# Patient Record
Sex: Male | Born: 1990 | Race: Black or African American | Hispanic: No | Marital: Single | Smoking: Former smoker
Health system: Southern US, Community
[De-identification: ages and names within clinical notes are randomized; demographics above are authoritative.]

## PROBLEM LIST (undated history)

## (undated) DIAGNOSIS — R011 Cardiac murmur, unspecified: Secondary | ICD-10-CM

## (undated) DIAGNOSIS — G43909 Migraine, unspecified, not intractable, without status migrainosus: Secondary | ICD-10-CM

## (undated) DIAGNOSIS — J45909 Unspecified asthma, uncomplicated: Secondary | ICD-10-CM

---

## 2013-03-16 ENCOUNTER — Emergency Department (HOSPITAL_COMMUNITY)
Admission: EM | Admit: 2013-03-16 | Discharge: 2013-03-16 | Disposition: A | Discharge status: Home / Self Care | Payer: BC Managed Care – PPO | Attending: Emergency Medicine | Admitting: Emergency Medicine

## 2013-03-16 ENCOUNTER — Encounter (HOSPITAL_COMMUNITY): Payer: Self-pay | Admitting: Emergency Medicine

## 2013-03-16 DIAGNOSIS — J45909 Unspecified asthma, uncomplicated: Secondary | ICD-10-CM | POA: Insufficient documentation

## 2013-03-16 DIAGNOSIS — F17200 Nicotine dependence, unspecified, uncomplicated: Secondary | ICD-10-CM | POA: Insufficient documentation

## 2013-03-16 DIAGNOSIS — R0789 Other chest pain: Secondary | ICD-10-CM | POA: Insufficient documentation

## 2013-03-16 DIAGNOSIS — Z79899 Other long term (current) drug therapy: Secondary | ICD-10-CM | POA: Insufficient documentation

## 2013-03-16 DIAGNOSIS — J069 Acute upper respiratory infection, unspecified: Secondary | ICD-10-CM | POA: Insufficient documentation

## 2013-03-16 HISTORY — DX: Unspecified asthma, uncomplicated: J45.909

## 2013-03-16 MED ORDER — ALBUTEROL SULFATE HFA 108 (90 BASE) MCG/ACT IN AERS: 1.0000 | INHALATION_SPRAY | Freq: Four times a day (QID) | RESPIRATORY_TRACT | Status: DC | PRN

## 2013-03-16 MED ORDER — ALBUTEROL SULFATE HFA 108 (90 BASE) MCG/ACT IN AERS
2.0000 | INHALATION_SPRAY | Freq: Once | RESPIRATORY_TRACT | Status: AC
  Administered 2013-03-16: 2 via RESPIRATORY_TRACT
  Filled 2013-03-16: qty 6.7

## 2013-03-16 NOTE — Discharge Instructions (Signed)
Upper Respiratory Infection, Adult An upper respiratory infection (URI) is also known as the common cold. It is often caused by a type of germ (virus). Colds are easily spread (contagious). You can pass it to others by kissing, coughing, sneezing, or drinking out of the same glass. Usually, you get better in 1 or 2 weeks.  HOME CARE   Only take medicine as told by your doctor.  Use a warm mist humidifier or breathe in steam from a hot shower.  Drink enough water and fluids to keep your pee (urine) clear or pale yellow.  Get plenty of rest.  Return to work when your temperature is back to normal or as told by your doctor. You may use a face mask and wash your hands to stop your cold from spreading. GET HELP RIGHT AWAY IF:   After the first few days, you feel you are getting worse.  You have questions about your medicine.  You have chills, shortness of breath, or brown or red spit (mucus).  You have yellow or brown snot (nasal discharge) or pain in the face, especially when you bend forward.  You have a fever, puffy (swollen) neck, pain when you swallow, or white spots in the back of your throat.  You have a bad headache, ear pain, sinus pain, or chest pain.  You have a high-pitched whistling sound when you breathe in and out (wheezing).  You have a lasting cough or cough up blood.  You have sore muscles or a stiff neck. MAKE SURE YOU:   Understand these instructions.  Will watch your condition.  Will get help right away if you are not doing well or get worse. Document Released: 06/18/2007 Document Revised: 03/24/2011 Document Reviewed: 05/06/2010 [facility] Patient Information 2014 [place], [place].   Use inhaler as needed Increase oral fluids

## 2013-03-16 NOTE — ED Notes (Signed)
Cold symptoms; runny nose; asthma symptoms x 4 days; wheezing at times

## 2013-03-16 NOTE — ED Provider Notes (Signed)
CSN: [id number]     Arrival date & time 03/16/13  1257 History   This chart was scribed for [name] by Ladona Ridgel Day, ED scribe. This patient was seen in room WTR7/WTR7 and the patient's care was started at 1257.  Chief Complaint  Patient presents with  . URI   The history is provided by the patient. No language interpreter was used.   HPI Comments: Glenn Davis is a 23 y.o. male who presents to the Emergency Department h/o asthma complaining of constant, gradually worsened chest tightness, onset 4 days ago. He reports that his sx feel similar to previous episodes of asthma/cough flare ups that he had years ago. He has not had to use an inhaler in years for this problem. He reports associated wheezing especially at night. He reports born w/bronchitis and asthma. He states a mild cough/cold.    Past Medical History  Diagnosis Date  . Asthma    History reviewed. No pertinent past surgical history. No family history on file. History  Substance Use Topics  . Smoking status: Current Every Day Smoker -- 0.50 packs/day    Types: Cigarettes  . Smokeless tobacco: Not on file  . Alcohol Use: Not on file    Review of Systems  Constitutional: Negative for fever and chills.  Respiratory: Positive for cough and chest tightness. Negative for shortness of breath.   Cardiovascular: Negative for chest pain.  Gastrointestinal: Negative for abdominal pain.  Musculoskeletal: Negative for back pain.  All other systems reviewed and are negative.    Allergies  Review of patient's allergies indicates no known allergies.  Home Medications   Current Outpatient Rx  Name  Route  Sig  Dispense  Refill  . albuterol (PROVENTIL HFA;VENTOLIN HFA) 108 (90 BASE) MCG/ACT inhaler   Inhalation   Inhale 1-2 puffs into the lungs every 6 (six) hours as needed for wheezing or shortness of breath.   1 Inhaler   0     Triage Vitals: BP 134/73  Pulse 73  Temp(Src) 97.9 F (36.6 C) (Oral)  Resp 16  SpO2  100%  Physical Exam  Nursing note and vitals reviewed. Constitutional: He is oriented to person, place, and time. He appears well-developed and well-nourished. No distress.  HENT:  Head: Normocephalic and atraumatic.  Fluid behind bilateral TMs  Eyes: Conjunctivae are normal. Right eye exhibits no discharge. Left eye exhibits no discharge.  Neck: Normal range of motion.  Cardiovascular: Normal rate, regular rhythm and normal heart sounds.   No murmur heard. Pulmonary/Chest: Effort normal and breath sounds normal. No respiratory distress. He has no wheezes. He has no rales.  Musculoskeletal: Normal range of motion. He exhibits no edema.  Neurological: He is alert and oriented to person, place, and time.  Skin: Skin is warm and dry.  Psychiatric: He has a normal mood and affect. Thought content normal.    ED Course  Procedures (including critical care time) DIAGNOSTIC STUDIES: Oxygen Saturation is 100% on room air, normal by my interpretation.    COORDINATION OF CARE: At 225 PM Discussed treatment plan with patient which includes breathing tx. Patient agrees.   Labs Review Labs Reviewed - No data to display Imaging Review No results found.   EKG Interpretation None      MDM   Final diagnoses:  URI (upper respiratory infection)   Albuterol given here with some relief. Pt reports that he has a history of asthma and when he gets a cold it sometimes triggers his asthma. No  tachypnea, tachycardia or hypoxia. SPO2 100%. Feels like he is ok to go home. Discussed plan of care with pt and he agrees. Return precautions given.   I personally performed the services described in this documentation, which was scribed in my presence. The recorded information has been reviewed and is accurate.      Irish EldersKelly Arayah Krouse, NP 03/22/13 [phone]

## 2013-03-23 NOTE — ED Provider Notes (Signed)
Medical screening examination/treatment/procedure(s) were performed by non-physician practitioner and as supervising physician I was immediately available for consultation/collaboration.   EKG Interpretation None        David H Yao, MD 03/23/13 1059 

## 2013-11-14 ENCOUNTER — Encounter (HOSPITAL_COMMUNITY): Payer: Self-pay | Admitting: Emergency Medicine

## 2013-11-14 ENCOUNTER — Emergency Department (HOSPITAL_COMMUNITY)
Admission: EM | Admit: 2013-11-14 | Discharge: 2013-11-14 | Disposition: A | Discharge status: Home / Self Care | Payer: BC Managed Care – PPO | Attending: Emergency Medicine | Admitting: Emergency Medicine

## 2013-11-14 DIAGNOSIS — J45909 Unspecified asthma, uncomplicated: Secondary | ICD-10-CM | POA: Diagnosis not present

## 2013-11-14 DIAGNOSIS — Z79899 Other long term (current) drug therapy: Secondary | ICD-10-CM | POA: Diagnosis not present

## 2013-11-14 DIAGNOSIS — J029 Acute pharyngitis, unspecified: Secondary | ICD-10-CM | POA: Insufficient documentation

## 2013-11-14 DIAGNOSIS — B349 Viral infection, unspecified: Secondary | ICD-10-CM | POA: Diagnosis not present

## 2013-11-14 DIAGNOSIS — Z720 Tobacco use: Secondary | ICD-10-CM | POA: Diagnosis not present

## 2013-11-14 DIAGNOSIS — G4489 Other headache syndrome: Secondary | ICD-10-CM

## 2013-11-14 DIAGNOSIS — R112 Nausea with vomiting, unspecified: Secondary | ICD-10-CM | POA: Insufficient documentation

## 2013-11-14 LAB — RAPID STREP SCREEN (MED CTR MEBANE ONLY): Streptococcus, Group A Screen (Direct): NEGATIVE

## 2013-11-14 MED ORDER — ONDANSETRON 4 MG PO TBDP
4.0000 mg | ORAL_TABLET | Freq: Once | ORAL | Status: AC
  Administered 2013-11-14: 4 mg via ORAL
  Filled 2013-11-14: qty 1

## 2013-11-14 MED ORDER — KETOROLAC TROMETHAMINE 60 MG/2ML IM SOLN
60.0000 mg | Freq: Once | INTRAMUSCULAR | Status: AC
  Administered 2013-11-14: 60 mg via INTRAMUSCULAR
  Filled 2013-11-14: qty 2

## 2013-11-14 MED ORDER — ACETAMINOPHEN 325 MG PO TABS
650.0000 mg | ORAL_TABLET | Freq: Once | ORAL | Status: AC
  Administered 2013-11-14: 650 mg via ORAL
  Filled 2013-11-14: qty 2

## 2013-11-14 MED ORDER — IBUPROFEN 600 MG PO TABS: 600.0000 mg | ORAL_TABLET | Freq: Four times a day (QID) | ORAL | Status: DC | PRN

## 2013-11-14 NOTE — ED Notes (Signed)
Pt c/o headache, sore throat and eye pain for past c/o days. Pt states ever since he started working at his new job in 20 degrees.

## 2013-11-14 NOTE — ED Provider Notes (Signed)
CSN: [id number]     Arrival date & time 11/14/13  0807 History   First MD Initiated Contact with Patient 11/14/13 [phone]     Chief Complaint  Patient presents with  . Headache  . Sore Throat     (Consider location/radiation/quality/duration/timing/severity/associated sxs/prior Treatment) HPI  This is a 23 year old with a history of asthma who presents with headache. Sore throat. Patient reports several days of headache. He reports that is bitemporal and "behind my eyes."  Current pain is 7 out of 10. Patient states that he also developed sore throat and fever yesterday. He is not had any difficulty swallowing. Reports fever to 101. Denies any neck stiffness or pain. He states that he vomited once at work earlier today and was instructed that he needed to be seen by Dr. He denies any diarrhea or abdominal pain. He denies any chest pain, shortness of breath, or cough.  Past Medical History  Diagnosis Date  . Asthma    History reviewed. No pertinent past surgical history. No family history on file. History  Substance Use Topics  . Smoking status: Current Every Day Smoker -- 0.50 packs/day    Types: Cigarettes  . Smokeless tobacco: Not on file  . Alcohol Use: No    Review of Systems  Constitutional: Positive for fever and chills.  HENT: Positive for sore throat.   Respiratory: Negative.  Negative for cough, chest tightness and shortness of breath.   Cardiovascular: Negative.  Negative for chest pain.  Gastrointestinal: Positive for nausea and vomiting. Negative for abdominal pain and diarrhea.  Genitourinary: Negative.  Negative for dysuria.  Musculoskeletal: Negative for back pain.  Skin: Negative for rash.  Neurological: Positive for headaches. Negative for dizziness, weakness, light-headedness and numbness.  All other systems reviewed and are negative.     Allergies  Review of patient's allergies indicates no known allergies.  Home Medications   Prior to Admission  medications   Medication Sig Start Date End Date Taking? Authorizing Provider  acetaminophen (TYLENOL) 325 MG tablet Take 325 mg by mouth every 6 (six) hours as needed for headache (headache).   Yes Historical Provider, MD  albuterol (PROVENTIL HFA;VENTOLIN HFA) 108 (90 BASE) MCG/ACT inhaler Inhale 2 puffs into the lungs every 6 (six) hours as needed for wheezing or shortness of breath (wheezing).   Yes Historical Provider, MD  albuterol (PROVENTIL HFA;VENTOLIN HFA) 108 (90 BASE) MCG/ACT inhaler Inhale 1-2 puffs into the lungs every 6 (six) hours as needed for wheezing or shortness of breath. 03/16/13   Irish EldersKelly Walker, NP  ibuprofen (ADVIL,MOTRIN) 600 MG tablet Take 1 tablet (600 mg total) by mouth every 6 (six) hours as needed. 11/14/13   Shon Batonourtney F Horton, MD   BP 141/86 mmHg  Pulse 82  Temp(Src) 98.2 F (36.8 C) (Oral)  Resp 18  SpO2 100% Physical Exam  Constitutional: He is oriented to person, place, and time. He appears well-developed and well-nourished. No distress.  HENT:  Head: Normocephalic and atraumatic.  Mild erythema and swelling to the posterior oropharynx and tonsils, uvula midline, no trismus  Eyes: Pupils are equal, round, and reactive to light.  Neck: Normal range of motion. Neck supple.  No meningismus  Cardiovascular: Normal rate, regular rhythm and normal heart sounds.   No murmur heard. Pulmonary/Chest: Effort normal and breath sounds normal. No respiratory distress. He has no wheezes.  Abdominal: Soft. Bowel sounds are normal. There is no tenderness. There is no rebound.  Musculoskeletal: He exhibits no edema.  Lymphadenopathy:  He has cervical adenopathy.  Neurological: He is alert and oriented to person, place, and time.  Skin: Skin is warm and dry.  Psychiatric: He has a normal mood and affect.  Nursing note and vitals reviewed.   ED Course  Procedures (including critical care time) Labs Review Labs Reviewed  RAPID STREP SCREEN  CULTURE, GROUP A STREP     Imaging Review No results found.   EKG Interpretation None      MDM   Final diagnoses:  Viral syndrome  Other headache syndrome  Sore throat   Patient presents with multiple complaints including headache, sore throat, fever and isolated vomiting. He is nontoxic and afebrile on exam. Exam is largely unremarkable. He does have mild erythema of the posterior oropharynx. He is nonfocal. Patient was given Toradol. Strep screen negative. Subsequently given Tylenol and Zofran with improvement of his symptoms. Suspect viral syndrome. Low suspicion at this time for meningitis. No evidence of meningismus patient is very well-appearing. Discuss with patient supportive care at home. [facility].  After history, exam, and medical workup I feel the patient has been appropriately medically screened and is safe for discharge home. Pertinent diagnoses were discussed with the patient. Patient was given return precautions.     Shon Batonourtney F Horton, MD 11/14/13 1045

## 2013-11-14 NOTE — Discharge Instructions (Signed)

## 2013-11-16 LAB — CULTURE, GROUP A STREP

## 2017-04-20 ENCOUNTER — Other Ambulatory Visit: Payer: Self-pay

## 2017-04-20 ENCOUNTER — Emergency Department (HOSPITAL_COMMUNITY)
Admission: EM | Admit: 2017-04-20 | Discharge: 2017-04-20 | Disposition: A | Discharge status: Home / Self Care | Payer: Self-pay | Attending: Emergency Medicine | Admitting: Emergency Medicine

## 2017-04-20 ENCOUNTER — Emergency Department (HOSPITAL_COMMUNITY): Payer: Self-pay

## 2017-04-20 DIAGNOSIS — J4521 Mild intermittent asthma with (acute) exacerbation: Secondary | ICD-10-CM | POA: Insufficient documentation

## 2017-04-20 LAB — BASIC METABOLIC PANEL
Anion gap: 12 (ref 5–15)
BUN: 10 mg/dL (ref 6–20)
CO2: 23 mmol/L (ref 22–32)
Calcium: 9.4 mg/dL (ref 8.9–10.3)
Chloride: 104 mmol/L (ref 101–111)
Creatinine, Ser: 0.91 mg/dL (ref 0.61–1.24)
GFR calc Af Amer: >60 mL/min (ref >60)
GFR calc non Af Amer: >60 mL/min (ref >60)
GLUCOSE: 98 mg/dL (ref 65–99)
POTASSIUM: 3.8 mmol/L (ref 3.5–5.1)
Sodium: 139 mmol/L (ref 135–145)

## 2017-04-20 LAB — CBC
HEMATOCRIT: 44.7 % (ref 39.0–52.0)
Hemoglobin: 14.7 g/dL (ref 13.0–17.0)
MCH: 29.5 pg (ref 26.0–34.0)
MCHC: 32.9 g/dL (ref 30.0–36.0)
MCV: 89.6 fL (ref 78.0–100.0)
Platelets: 259 10*3/uL (ref 150–400)
RBC: 4.99 MIL/uL (ref 4.22–5.81)
RDW: 12.9 % (ref 11.5–15.5)
WBC: 8.6 10*3/uL (ref 4.0–10.5)

## 2017-04-20 LAB — I-STAT TROPONIN, ED: Troponin i, poc: 0 ng/mL (ref 0.00–0.08)

## 2017-04-20 MED ORDER — ALBUTEROL SULFATE HFA 108 (90 BASE) MCG/ACT IN AERS: 1.0000 | INHALATION_SPRAY | Freq: Four times a day (QID) | RESPIRATORY_TRACT | 0 refills | Status: AC | PRN

## 2017-04-20 MED ORDER — ALBUTEROL SULFATE (2.5 MG/3ML) 0.083% IN NEBU
5.0000 mg | INHALATION_SOLUTION | Freq: Once | RESPIRATORY_TRACT | Status: AC
  Administered 2017-04-20: 5 mg via RESPIRATORY_TRACT
  Filled 2017-04-20: qty 6

## 2017-04-20 MED ORDER — BENZONATATE 100 MG PO CAPS: 100.0000 mg | ORAL_CAPSULE | Freq: Three times a day (TID) | ORAL | 0 refills | Status: DC

## 2017-04-20 MED ORDER — ALBUTEROL SULFATE HFA 108 (90 BASE) MCG/ACT IN AERS
1.0000 | INHALATION_SPRAY | RESPIRATORY_TRACT | Status: DC | PRN
  Administered 2017-04-20: 1 via RESPIRATORY_TRACT
  Filled 2017-04-20: qty 6.7

## 2017-04-20 NOTE — ED Provider Notes (Signed)
[facility] EMERGENCY DEPARTMENT Provider Note   CSN: [id number] Arrival date & time: 04/20/17  4098     History   Chief Complaint Chief Complaint  Patient presents with  . Asthma  . Chest Pain    HPI Glenn Davis is a 27 y.o. male.  HPI   27 year old male with a history of asthma presents today with asthma exacerbation.  Patient notes that he has had a month since a kid used to take Advair but no longer needed it.  He reports he rarely has asthma exacerbations but over the last week he has had a minor cough and feels as if he has a light cold.  He also notes that he has been around a lot of dust at work which he attributes to the flare.  Patient notes he does not have an inhaler at home and felt chest tightness today at work.  Patient denies any cardiac or other pulmonary history including DVT or PE.  Patient was given a breathing treatment prior to my assessment which he reports significantly improved his symptoms.  Patient denies any fever.  Patient reports he is a smoker.  Past Medical History:  Diagnosis Date  . Asthma     There are no active problems to display for this patient.   No past surgical history on file.      Home Medications    Prior to Admission medications   Medication Sig Start Date End Date Taking? Authorizing Provider  acetaminophen (TYLENOL) 325 MG tablet Take 325 mg by mouth every 6 (six) hours as needed for headache (headache).    [provider]  albuterol (PROVENTIL HFA;VENTOLIN HFA) 108 (90 Base) MCG/ACT inhaler Inhale 1-2 puffs into the lungs every 6 (six) hours as needed for wheezing or shortness of breath. 04/20/17   Khup Sapia, Tinnie Gens, PA-C  benzonatate (TESSALON) 100 MG capsule Take 1 capsule (100 mg total) by mouth every 8 (eight) hours. 04/20/17   Perrin Eddleman, Tinnie Gens, PA-C  ibuprofen (ADVIL,MOTRIN) 600 MG tablet Take 1 tablet (600 mg total) by mouth every 6 (six) hours as needed. 11/14/13   Horton, Mayer Masker, MD    Family  History No family history on file.  Social History Social History   Tobacco Use  . Smoking status: Current Every Day Smoker    Packs/day: 0.50    Types: Cigarettes  Substance Use Topics  . Alcohol use: No  . Drug use: Not on file     Allergies   Patient has no known allergies.   Review of Systems Review of Systems  All other systems reviewed and are negative.    Physical Exam Updated Vital Signs BP 122/76 (BP Location: Right Arm)   Pulse 66   Temp 98.9 F (37.2 C) (Oral)   Resp 16   Wt 79.4 kg (175 lb)   SpO2 100%   Physical Exam  Constitutional: He is oriented to person, place, and time. He appears well-developed and well-nourished.  HENT:  Head: Normocephalic and atraumatic.  Eyes: Pupils are equal, round, and reactive to light. Conjunctivae are normal. Right eye exhibits no discharge. Left eye exhibits no discharge. No scleral icterus.  Neck: Normal range of motion. No JVD present. No tracheal deviation present.  Cardiovascular: Normal rate, regular rhythm, normal heart sounds and intact distal pulses. Exam reveals no gallop and no friction rub.  No murmur heard. Pulmonary/Chest: Effort normal and breath sounds normal. No stridor. No respiratory distress. He has no wheezes. He has no rales.  Neurological: He is alert and oriented to person, place, and time. Coordination normal.  Psychiatric: He has a normal mood and affect. His behavior is normal. Judgment and thought content normal.  Nursing note and vitals reviewed.   ED Treatments / Results  Labs (all labs ordered are listed, but only abnormal results are displayed) Labs Reviewed  BASIC METABOLIC PANEL  CBC  I-STAT TROPONIN, ED    EKG None   ED ECG REPORT   Date: 04/20/2017  Rate: 67  Rhythm: normal sinus rhythm  QRS Axis: normal  Intervals: normal  ST/T Wave abnormalities: normal  Conduction Disutrbances:none  Narrative Interpretation:   Old EKG Reviewed: none available  I have  personally reviewed the EKG tracing and agree with the computerized printout as noted.   Radiology Dg Chest 2 View  Result Date: 04/20/2017 CLINICAL DATA:  Chest tightness, intermittent shortness of breath, and wheezing for the past 2 days. Possible dust inhalation triggered the episode. History of asthma, current smoker. EXAM: CHEST - 2 VIEW COMPARISON:  None in PACs FINDINGS: The lungs are adequately inflated and clear. The heart and pulmonary vascularity are normal. The mediastinum is normal in width. There is no pleural effusion. The trachea is midline. The bony thorax exhibits no acute abnormality. IMPRESSION: There is no pneumonia nor other acute cardiopulmonary abnormality. Electronically Signed   By: David  SwazilandJordan M.D.   On: 04/20/2017 10:23    Procedures Procedures (including critical care time)  Medications Ordered in ED Medications  albuterol (PROVENTIL HFA;VENTOLIN HFA) 108 (90 Base) MCG/ACT inhaler 1-2 puff (has no administration in time range)  albuterol (PROVENTIL) (2.5 MG/3ML) 0.083% nebulizer solution 5 mg (5 mg Nebulization Given 04/20/17 0959)     Initial Impression / Assessment and Plan / ED Course  I have reviewed the triage vital signs and the nursing notes.  Pertinent labs & imaging results that were available during my care of the patient were reviewed by me and considered in my medical decision making (see chart for details).      Final Clinical Impressions(s) / ED Diagnoses   Final diagnoses:  Mild intermittent asthma with exacerbation   Labs: I-STAT troponin, BMP, CBC  Imaging: DG chest 2 view, ED EKG  Consults:  Therapeutics: Albuterol  Discharge Meds: Albuterol, tessalon   Assessment/Plan: 27 year old male presents today with likely asthma exacerbation.  Patient reports this is similar to previous.  I have very low suspicion for cardiac etiology of his symptoms today.  Symptoms improved with breathing treatment.  Patient has reassuring analysis  here including EKG, chest x-ray and labs.  Patient be discharged with cough medication, albuterol inhaler and encouragement to follow-up with primary care for further evaluation management.  Patient given strict return precautions, he verbalized understanding and agreement to today's plan had no further questions or concerns   ED Discharge Orders        Ordered    benzonatate (TESSALON) 100 MG capsule  Every 8 hours     04/20/17 1240    albuterol (PROVENTIL HFA;VENTOLIN HFA) 108 (90 Base) MCG/ACT inhaler  Every 6 hours PRN     04/20/17 1241       Eyvonne MechanicHedges, Deundra Furber, PA-C 04/20/17 1242    Tilden Fossaees, Elizabeth, MD 04/20/17 [phone]

## 2017-04-20 NOTE — Discharge Instructions (Addendum)
Please read attached information. If you experience any new or worsening signs or symptoms please return to the emergency room for evaluation. Please follow-up with your primary care provider or specialist as discussed. Please use medication prescribed only as directed and discontinue taking if you have any concerning signs or symptoms.   °

## 2017-04-20 NOTE — ED Triage Notes (Signed)
PT reports history of asthma. PT reports chest tightness, intermittent SOB, and wheezing for 2 days. PT no longer has an albuterol inhaler. PT reports today's flare up started as he was sweeping the floor. He believes the dust aggravated his breathing.   PT reports lightheadedness with episode this AM.

## 2017-05-12 ENCOUNTER — Emergency Department (HOSPITAL_COMMUNITY)
Admission: EM | Admit: 2017-05-12 | Discharge: 2017-05-12 | Disposition: A | Discharge status: Home / Self Care | Payer: Self-pay | Attending: Emergency Medicine | Admitting: Emergency Medicine

## 2017-05-12 ENCOUNTER — Encounter (HOSPITAL_COMMUNITY): Payer: Self-pay | Admitting: Emergency Medicine

## 2017-05-12 DIAGNOSIS — F17210 Nicotine dependence, cigarettes, uncomplicated: Secondary | ICD-10-CM | POA: Insufficient documentation

## 2017-05-12 DIAGNOSIS — J45909 Unspecified asthma, uncomplicated: Secondary | ICD-10-CM | POA: Insufficient documentation

## 2017-05-12 DIAGNOSIS — R519 Headache, unspecified: Secondary | ICD-10-CM

## 2017-05-12 DIAGNOSIS — R51 Headache: Secondary | ICD-10-CM | POA: Insufficient documentation

## 2017-05-12 HISTORY — DX: Migraine, unspecified, not intractable, without status migrainosus: G43.909

## 2017-05-12 MED ORDER — SODIUM CHLORIDE 0.9 % IV BOLUS
1000.0000 mL | Freq: Once | INTRAVENOUS | Status: AC
  Administered 2017-05-12: 1000 mL via INTRAVENOUS

## 2017-05-12 MED ORDER — BUTALBITAL-APAP-CAFFEINE 50-325-40 MG PO TABS: 1.0000 | ORAL_TABLET | Freq: Four times a day (QID) | ORAL | 0 refills | Status: AC | PRN

## 2017-05-12 MED ORDER — PROCHLORPERAZINE EDISYLATE 10 MG/2ML IJ SOLN
10.0000 mg | Freq: Once | INTRAMUSCULAR | Status: AC
  Administered 2017-05-12: 10 mg via INTRAVENOUS
  Filled 2017-05-12: qty 2

## 2017-05-12 MED ORDER — DIPHENHYDRAMINE HCL 50 MG/ML IJ SOLN
25.0000 mg | Freq: Once | INTRAMUSCULAR | Status: AC
  Administered 2017-05-12: 25 mg via INTRAVENOUS
  Filled 2017-05-12: qty 1

## 2017-05-12 NOTE — ED Triage Notes (Signed)
Pt reports had intermittent migraines for past 2 days. Will go away with advil then come back. denies n/v reports "depends" when asked if sensitive to light.

## 2017-05-12 NOTE — ED Provider Notes (Signed)
[facility]-EMERGENCY DEPT Provider Note   CSN: [id number] Arrival date & time: 05/12/17  1032     History   Chief Complaint Chief Complaint  Patient presents with  . Migraine    HPI Glenn Davis is a 27 y.o. male here for evaluation of headache x 1 week. Pain is intermittent. Pain is localized to forehead.  Associated with photophobia.  Has tried tylenol which provides brief relief of pain but pain returns. HA more intense in the last 2 days.  States he does he HA occasionally that usually last 1-2 days an resolve with tylenol.  Has been trying to cut back on cigarette use in the last 2 weeks.    No fevers, chills, neck stiffness, visual disturbances, vomiting, one sided numbness or weakness, slurred speech, dizziness, trauma, anticoagulation, syncope, seizures, generalized rash. No h/o CVA/TIA, HTN or immunocompromise.HPI  Past Medical History:  Diagnosis Date  . Asthma   . Migraine     There are no active problems to display for this patient.   History reviewed. No pertinent surgical history.      Home Medications    Prior to Admission medications   Medication Sig Start Date End Date Taking? Authorizing Provider  acetaminophen (TYLENOL) 500 MG tablet Take 500 mg by mouth daily as needed (migraine).   Yes [provider]  albuterol (PROVENTIL HFA;VENTOLIN HFA) 108 (90 Base) MCG/ACT inhaler Inhale 1-2 puffs into the lungs every 6 (six) hours as needed for wheezing or shortness of breath. 04/20/17  Yes Hedges, Tinnie Gens, PA-C  ibuprofen (ADVIL,MOTRIN) 200 MG tablet Take 200 mg by mouth daily as needed (migraine).   Yes [provider]  benzonatate (TESSALON) 100 MG capsule Take 1 capsule (100 mg total) by mouth every 8 (eight) hours. Patient not taking: Reported on 05/12/2017 04/20/17   Hedges, Tinnie Gens, PA-C  butalbital-acetaminophen-caffeine (FIORICET, ESGIC) [phone] MG tablet Take 1-2 tablets by mouth every 6 (six) hours as needed for  headache. 05/12/17 05/12/18  Liberty Handy, PA-C  ibuprofen (ADVIL,MOTRIN) 600 MG tablet Take 1 tablet (600 mg total) by mouth every 6 (six) hours as needed. Patient not taking: Reported on 05/12/2017 11/14/13   Horton, Mayer Masker, MD    Family History No family history on file.  Social History Social History   Tobacco Use  . Smoking status: Current Every Day Smoker    Packs/day: 0.50    Types: Cigarettes  . Smokeless tobacco: Never Used  Substance Use Topics  . Alcohol use: No  . Drug use: Not on file     Allergies   Patient has no known allergies.   Review of Systems Review of Systems  Eyes: Positive for photophobia.  Neurological: Positive for headaches.  All other systems reviewed and are negative.    Physical Exam Updated Vital Signs BP 126/70 (BP Location: Right Arm)   Pulse 71   Temp 98.3 F (36.8 C) (Oral)   Resp 18   Ht  (1.651 m)   Wt 79.4 kg (175 lb)   SpO2 99%   BMI 29.12 kg/m   Physical Exam  Constitutional: He appears well-developed.  Non-toxic appearance.  NAD.  HENT:  Head: Normocephalic and atraumatic.  Right Ear: External ear normal.  Left Ear: External ear normal.  Nose: Nose normal. No mucosal edema or septal deviation.  Moist mucous membranes Uvula midline Oropharynx and tonsils normal No tenderness over temporal arteries  Eyes: Conjunctivae and lids are normal.  Unable to visualize back of eye  Neck:  No c spine spinous process or muscular tenderness  Full PROM of neck w/o rigidity  No meningeal signs   Cardiovascular: Normal rate, regular rhythm and normal heart sounds.  Pulses:      Radial pulses are 2+ on the right side, and 2+ on the left side.       Dorsalis pedis pulses are 2+ on the right side, and 2+ on the left side.  Pulmonary/Chest: Effort normal and breath sounds normal.  Lymphadenopathy:  No cervical adenopathy  Neurological: He is alert. GCS eye subscore is 4. GCS verbal subscore is 5. GCS motor subscore  is 6.  Alert and oriented to self, place, time and event.  Speech is fluent without aphasia. Strength 5/5 with hand grip and ankle F/E.   Sensation to light touch intact in hands and feet. Normal gait. No pronator drift.  Normal finger-to-nose and heel-to-shin test.  CN I and VIII not tested. CN II-XII intact bilaterally.   Skin: Skin is warm and dry. Capillary refill takes less than 2 seconds. No rash noted.  Psychiatric: He has a normal mood and affect. His speech is normal and behavior is normal. Judgment and thought content normal.     ED Treatments / Results  Labs (all labs ordered are listed, but only abnormal results are displayed) Labs Reviewed - No data to display  EKG None  Radiology No results found.  Procedures Procedures (including critical care time)  Medications Ordered in ED Medications  prochlorperazine (COMPAZINE) injection 10 mg (10 mg Intravenous Given 05/12/17 1323)  diphenhydrAMINE (BENADRYL) injection 25 mg (25 mg Intravenous Given 05/12/17 1323)  sodium chloride 0.9 % bolus 1,000 mL (0 mLs Intravenous Stopped 05/12/17 1410)     Initial Impression / Assessment and Plan / ED Course  I have reviewed the triage vital signs and the nursing notes.  Pertinent labs & imaging results that were available during my care of the patient were reviewed by me and considered in my medical decision making (see chart for details).     27 year old with history of previous headaches here for evaluation of intermittent, persistent frontal headache for 1 week.  Worsening over 2 days.  Patient is without high risk features of headache including fever, neck pain/rigidity, trauma, anticoagulation, AMS, seizures, hypertension, CVA/TIA.  He has been trying to cut back on tobacco use over the last 2 weeks, this could be contributing.  On exam VS are WNL, patient well-appearing with no meningismus, nystagmus, focal neuro deficits, pain over temporal arteries.  Given reassuring hx  and exam, emergent imaging or labs not indicated given. Low suspicion for emergent intracranial or vascular etiology.    Pt given migraine cocktail w/ resolution of HA. Pt adequate for DC. Discussed s/s that would warrant return to ED. Pt verbalized understanding and agreeable with ED tx and dc plan.   Final Clinical Impressions(s) / ED Diagnoses   Final diagnoses:  Bad headache    ED Discharge Orders        Ordered    butalbital-acetaminophen-caffeine (FIORICET, ESGIC) 50-325-40 MG tablet  Every 6 hours PRN     05/12/17 1424       Liberty Handy, [place] 05/12/17 1432    Arby Barrette, MD 05/15/17 1426

## 2017-05-12 NOTE — Discharge Instructions (Signed)
Take ibuprofen (aleve, advil) or acetaminophen (tylenol) for headache every 6-8 hours. Do not take for more than 2-3 days. These medicines can be irritating to stomach and can cause ulcers and gastritis.    If headache does not respond to ibuprofen/acetaminophen. Take 1-2 fioricet.    Return for worsening, severe, headache with fevers, neck stiffness, vision changes, vomiting, one sided numbness or weakness, rash.

## 2017-06-23 ENCOUNTER — Emergency Department (HOSPITAL_COMMUNITY)
Admission: EM | Admit: 2017-06-23 | Discharge: 2017-06-23 | Disposition: A | Discharge status: Home / Self Care | Payer: Self-pay | Attending: Emergency Medicine | Admitting: Emergency Medicine

## 2017-06-23 ENCOUNTER — Encounter (HOSPITAL_COMMUNITY): Payer: Self-pay | Admitting: Emergency Medicine

## 2017-06-23 ENCOUNTER — Other Ambulatory Visit: Payer: Self-pay

## 2017-06-23 DIAGNOSIS — F17210 Nicotine dependence, cigarettes, uncomplicated: Secondary | ICD-10-CM | POA: Insufficient documentation

## 2017-06-23 DIAGNOSIS — X500XXA Overexertion from strenuous movement or load, initial encounter: Secondary | ICD-10-CM | POA: Insufficient documentation

## 2017-06-23 DIAGNOSIS — J45909 Unspecified asthma, uncomplicated: Secondary | ICD-10-CM | POA: Insufficient documentation

## 2017-06-23 DIAGNOSIS — Y929 Unspecified place or not applicable: Secondary | ICD-10-CM | POA: Insufficient documentation

## 2017-06-23 DIAGNOSIS — Z79899 Other long term (current) drug therapy: Secondary | ICD-10-CM | POA: Insufficient documentation

## 2017-06-23 DIAGNOSIS — S39012A Strain of muscle, fascia and tendon of lower back, initial encounter: Secondary | ICD-10-CM

## 2017-06-23 DIAGNOSIS — Y990 Civilian activity done for income or pay: Secondary | ICD-10-CM | POA: Insufficient documentation

## 2017-06-23 DIAGNOSIS — Y9389 Activity, other specified: Secondary | ICD-10-CM | POA: Insufficient documentation

## 2017-06-23 MED ORDER — METHOCARBAMOL 500 MG PO TABS: 500.0000 mg | ORAL_TABLET | Freq: Two times a day (BID) | ORAL | 0 refills | Status: DC

## 2017-06-23 NOTE — ED Provider Notes (Signed)
[facility]   CSN: [id number] Arrival date & time: 06/23/17  1300     History   Chief Complaint Chief Complaint  Patient presents with  . Back Pain    HPI Glenn Davis is a 27 y.o. male.  HPI  Glenn Davis is a 27yo male with a history of asthma who presents to the emergency department for evaluation of lower back pain. 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  He reports that his pain began about 2 or 3 days ago. Cannot recall a specific injury. States that pain is only present when he bends over and tries to reach. His pain is in the bilateral lower back, does not radiate and is described as sharp. He tried taking one of his friend's muscle relaxers which alleviated his symptoms but put him straight to sleep.  He denies fever, chills, night sweats, weight changes, numbness, weakness, loss of bowel or bladder control, abdominal pain, dysuria, urinary frequency.  Denies personal history of cancer or IV drug use.  Is able to ambulate independently despite pain.  Past Medical History:  Diagnosis Date  . Asthma   . Migraine     There are no active problems to display for this patient.   History reviewed. No pertinent surgical history.      Home Medications    Prior to Admission medications   Medication Sig Start Date End Date Taking? Authorizing Provider  acetaminophen (TYLENOL) 500 MG tablet Take 500 mg by mouth daily as needed (migraine).    [provider]  albuterol (PROVENTIL HFA;VENTOLIN HFA) 108 (90 Base) MCG/ACT inhaler Inhale 1-2 puffs into the lungs every 6 (six) hours as needed for wheezing or shortness of breath. 04/20/17   Hedges, Tinnie GensJeffrey, PA-C  benzonatate (TESSALON) 100 MG capsule Take 1 capsule (100 mg total) by mouth every 8 (eight) hours. Patient not taking: Reported on 05/12/2017 04/20/17   Eyvonne MechanicHedges, Jeffrey, PA-C    butalbital-acetaminophen-caffeine (FIORICET, ESGIC) [phone] MG tablet Take 1-2 tablets by mouth every 6 (six) hours as needed for headache. 05/12/17 05/12/18  Liberty HandyGibbons, Claudia J, PA-C  ibuprofen (ADVIL,MOTRIN) 200 MG tablet Take 200 mg by mouth daily as needed (migraine).    [provider]  ibuprofen (ADVIL,MOTRIN) 600 MG tablet Take 1 tablet (600 mg total) by mouth every 6 (six) hours as needed. Patient not taking: Reported on 05/12/2017 11/14/13   Horton, Mayer Maskerourtney F, MD    Family History No family history on file.  Social History Social History   Tobacco Use  . Smoking status: Current Every Day Smoker    Packs/day: 0.50    Types: Cigarettes  . Smokeless tobacco: Never Used  Substance Use Topics  . Alcohol use: No  . Drug use: Not on file     Allergies   Patient has no known allergies.   Review of Systems Review of Systems  Constitutional: Negative for chills and fever.  Gastrointestinal: Negative for abdominal pain.  Genitourinary: Negative for difficulty urinating, dysuria, flank pain and frequency.  Musculoskeletal: Positive for back pain. Negative for gait problem.  Skin: Negative for rash and wound.  Neurological: Negative for weakness and numbness.     Physical Exam Updated Vital Signs BP 111/80 (BP Location: Right Arm)   Pulse 75   Temp 98.1 F (36.7 C) (Oral)   Resp 16   SpO2 100%   Physical Exam  Constitutional:  He is oriented to person, place, and time. He appears well-developed and well-nourished. No distress.  HENT:  Head: Normocephalic and atraumatic.  Eyes: Right eye exhibits no discharge. Left eye exhibits no discharge.  Pulmonary/Chest: Effort normal. No respiratory distress.  Abdominal: Soft. Bowel sounds are normal. There is no tenderness.  Musculoskeletal:  Tender to palpation over bilateral paraspinal muscles of the lumbar spine.  No overlying rash.  No midline T-spine or L-spine tenderness.  Strength 5/5 in bilateral knee  flexion/extension and ankle dorsiflexion/plantarflexion.  DP pulses 2+ bilaterally.  Neurological: He is alert and oriented to person, place, and time. Coordination normal.  Patellar reflex 2+ and symmetric bilaterally.  Distal sensation to light touch intact in bilateral lower extremities.  Gait normal coordination and balance.  Skin: Skin is warm and dry. Capillary refill takes less than 2 seconds. He is not diaphoretic.  Psychiatric: He has a normal mood and affect. His behavior is normal.  Nursing note and vitals reviewed.    ED Treatments / Results  Labs (all labs ordered are listed, but only abnormal results are displayed) Labs Reviewed - No data to display  EKG None  Radiology No results found.  Procedures Procedures (including critical care time)  Medications Ordered in ED Medications - No data to display   Initial Impression / Assessment and Plan / ED Course  I have reviewed the triage vital signs and the nursing notes.  Pertinent labs & imaging results that were available during my care of the patient were reviewed by me and considered in my medical decision making (see chart for details).    Symptoms consistent with musculoskeletal lower back strain. No neurological deficits and normal neuro exam.  Patient can walk without difficulty. No loss of bowel or bladder control. No concern for cauda equina.  No fever, night sweats, weight loss, h/o cancer, IVDU. RICE protocol and pain medicine indicated and discussed with patient.  He does not have a PCP, have given an information to establish care in his discharge paperwork.  Counseled him on reasons to return to the emergency department he agrees and voiced understanding to the above plan and appears reliable for follow-up.  Final Clinical Impressions(s) / ED Diagnoses   Final diagnoses:  Back strain, initial encounter    ED Discharge Orders        Ordered    methocarbamol (ROBAXIN) 500 MG tablet  2 times daily      06/23/17 1647       Kellie Shropshire, PA-C 06/23/17 1658    Mancel Bale, MD 06/24/17 1011

## 2017-06-23 NOTE — ED Notes (Signed)
Pt verbalized understanding of discharge instructions and denies any further questions at this time.   

## 2017-06-23 NOTE — Discharge Instructions (Signed)
You have lower back strain.  Please take 800 mg ibuprofen every 6 hours.  You can also take Tylenol 650 mg every 6 hours.  Apply heat to the lower back for 15 minutes at a time twice a day to help with your symptoms.  I have also written you a prescription for a muscle relaxer medicine.  This medicine can make you drowsy so please do not drive or work while taking it.  I have highlighted information to find a primary care doctor in the back of this paperwork.  Please call to establish care.  Return to the emergency department if you have any new or concerning symptoms like loss of bowel or bladder control, back pain with numbness or weakness.

## 2017-06-23 NOTE — ED Triage Notes (Signed)
Patient complains of new onset of lower back pain when he bends over that started three days ago. Patient states he works in a [employer] and suspects he injured himself there but does not recall a specific injury. PAtient alert, oriented, and in no apparent distress at this time.

## 2017-10-06 ENCOUNTER — Encounter (HOSPITAL_COMMUNITY): Payer: Self-pay

## 2017-10-06 ENCOUNTER — Other Ambulatory Visit: Payer: Self-pay

## 2017-10-06 ENCOUNTER — Emergency Department (HOSPITAL_COMMUNITY)
Admission: EM | Admit: 2017-10-06 | Discharge: 2017-10-06 | Disposition: A | Discharge status: Home / Self Care | Payer: Self-pay | Attending: Emergency Medicine | Admitting: Emergency Medicine

## 2017-10-06 DIAGNOSIS — J45909 Unspecified asthma, uncomplicated: Secondary | ICD-10-CM | POA: Insufficient documentation

## 2017-10-06 DIAGNOSIS — R0981 Nasal congestion: Secondary | ICD-10-CM | POA: Insufficient documentation

## 2017-10-06 DIAGNOSIS — Z79899 Other long term (current) drug therapy: Secondary | ICD-10-CM | POA: Insufficient documentation

## 2017-10-06 DIAGNOSIS — F17210 Nicotine dependence, cigarettes, uncomplicated: Secondary | ICD-10-CM | POA: Insufficient documentation

## 2017-10-06 DIAGNOSIS — R05 Cough: Secondary | ICD-10-CM | POA: Insufficient documentation

## 2017-10-06 DIAGNOSIS — J029 Acute pharyngitis, unspecified: Secondary | ICD-10-CM | POA: Insufficient documentation

## 2017-10-06 HISTORY — DX: Cardiac murmur, unspecified: R01.1

## 2017-10-06 MED ORDER — FLUTICASONE PROPIONATE 50 MCG/ACT NA SUSP: 1.0000 | Freq: Every day | NASAL | 2 refills | Status: AC

## 2017-10-06 MED ORDER — BENZONATATE 100 MG PO CAPS: 100.0000 mg | ORAL_CAPSULE | Freq: Three times a day (TID) | ORAL | 0 refills | Status: AC

## 2017-10-06 MED ORDER — LIDOCAINE VISCOUS HCL 2 % MT SOLN: 15.0000 mL | OROMUCOSAL | 0 refills | Status: AC | PRN

## 2017-10-06 NOTE — Discharge Instructions (Addendum)
Return to ED for worsening symptoms, trouble breathing or trouble swallowing, trouble moving your neck, chest pain or coughing up blood.

## 2017-10-06 NOTE — ED Notes (Signed)
Bed: WTR6 Expected date:  Expected time:  Means of arrival:  Comments: 

## 2017-10-06 NOTE — ED Provider Notes (Signed)
[facility]-EMERGENCY DEPT Provider Note   CSN: [id number] Arrival date & time: 10/06/17  1033     History   Chief Complaint Chief Complaint  Patient presents with  . Sore Throat  . Nasal Congestion    HPI Glenn Davis is a 27 y.o. male with a past medical history of asthma who presents to ED for evaluation of 48-hour history of sore throat, nasal congestion, dry cough.  Unsure if he had sick contacts at home with similar symptoms.  No improvement with cough drops.  Denies any flareups of his asthma.  Denies any recent travel, hemoptysis, chest pain, shortness of breath, wheezing, fever.  HPI  Past Medical History:  Diagnosis Date  . Asthma   . Migraine   . Murmur     There are no active problems to display for this patient.   History reviewed. No pertinent surgical history.      Home Medications    Prior to Admission medications   Medication Sig Start Date End Date Taking? Authorizing Provider  acetaminophen (TYLENOL) 500 MG tablet Take 500 mg by mouth daily as needed (migraine).    [provider]  albuterol (PROVENTIL HFA;VENTOLIN HFA) 108 (90 Base) MCG/ACT inhaler Inhale 1-2 puffs into the lungs every 6 (six) hours as needed for wheezing or shortness of breath. 04/20/17   Hedges, Tinnie GensJeffrey, PA-C  benzonatate (TESSALON) 100 MG capsule Take 1 capsule (100 mg total) by mouth every 8 (eight) hours. 10/06/17   Dietrich PatesKhatri, Khizar Fiorella, PA-C  butalbital-acetaminophen-caffeine (FIORICET, ESGIC) [phone] MG tablet Take 1-2 tablets by mouth every 6 (six) hours as needed for headache. 05/12/17 05/12/18  Liberty HandyGibbons, Claudia J, PA-C  fluticasone (FLONASE) 50 MCG/ACT nasal spray Place 1 spray into both nostrils daily. 10/06/17   Josie Mesa, PA-C  ibuprofen (ADVIL,MOTRIN) 200 MG tablet Take 200 mg by mouth daily as needed (migraine).    [provider]  ibuprofen (ADVIL,MOTRIN) 600 MG tablet Take 1 tablet (600 mg total) by mouth every 6 (six) hours as  needed. Patient not taking: Reported on 05/12/2017 11/14/13   Horton, Mayer Maskerourtney F, MD  lidocaine (XYLOCAINE) 2 % solution Use as directed 15 mLs in the mouth or throat as needed for mouth pain. 10/06/17   Myiah Petkus, PA-C  methocarbamol (ROBAXIN) 500 MG tablet Take 1 tablet (500 mg total) by mouth 2 (two) times daily. 06/23/17   Kellie ShropshireShrosbree, Emily J, PA-C    Family History History reviewed. No pertinent family history.  Social History Social History   Tobacco Use  . Smoking status: Current Every Day Smoker    Packs/day: 0.50    Types: Cigarettes  . Smokeless tobacco: Never Used  Substance Use Topics  . Alcohol use: No  . Drug use: Never     Allergies   Patient has no known allergies.   Review of Systems Review of Systems  Constitutional: Negative for chills and fever.  HENT: Positive for congestion, rhinorrhea, sinus pressure and sore throat. Negative for ear discharge, ear pain, facial swelling, mouth sores, trouble swallowing and voice change.   Respiratory: Positive for cough. Negative for shortness of breath.   Cardiovascular: Negative for chest pain.     Physical Exam Updated Vital Signs BP 137/87 (BP Location: Left Arm)   Pulse 75   Temp 98.9 F (37.2 C) (Oral)   Resp 18   Ht 5\' 6"  (1.676 m)   Wt 79.4 kg   SpO2 100%   BMI 28.25 kg/m   Physical Exam  Constitutional:  He appears well-developed and well-nourished. No distress.  HENT:  Head: Normocephalic and atraumatic.  Right Ear: Tympanic membrane normal.  Left Ear: Tympanic membrane normal.  Nose: Rhinorrhea present.  Mouth/Throat: Uvula is midline, oropharynx is clear and moist and mucous membranes are normal. Tonsils are 0 on the right. Tonsils are 0 on the left. No tonsillar exudate.  Patient does not appear to be in acute distress. No trismus or drooling present. No pooling of secretions. Patient is tolerating secretions and is not in respiratory distress. No neck pain or tenderness to palpation of the  neck. Full active and passive range of motion of the neck. No evidence of RPA or PTA.  Eyes: Conjunctivae and EOM are normal. No scleral icterus.  Neck: Normal range of motion.  Pulmonary/Chest: Effort normal. No respiratory distress.  Neurological: He is alert.  Skin: No rash noted. He is not diaphoretic.  Psychiatric: He has a normal mood and affect.  Nursing note and vitals reviewed.    ED Treatments / Results  Labs (all labs ordered are listed, but only abnormal results are displayed) Labs Reviewed  GROUP A STREP BY PCR    EKG None  Radiology No results found.  Procedures Procedures (including critical care time)  Medications Ordered in ED Medications - No data to display   Initial Impression / Assessment and Plan / ED Course  I have reviewed the triage vital signs and the nursing notes.  Pertinent labs & imaging results that were available during my care of the patient were reviewed by me and considered in my medical decision making (see chart for details).     Patient with sore throat. Tonsils not enlarged on exam. Pt is tolerating secretions, not in respiratory distress, no neck pain, no trismus. Presentation not concerning for peritonsillar abscess or spread of infection to deep spaces of the throat; patent airway. Pt will be discharged with supportive measures. Ibuprofen or Tylenol as needed for pain/fever. Specific return precautions discussed. Recommended PCP follow up. Pt appears safe for discharge.   Portions of this note were generated with Scientist, clinical (histocompatibility and immunogenetics). Dictation errors may occur despite best attempts at proofreading.   Final Clinical Impressions(s) / ED Diagnoses   Final diagnoses:  Pharyngitis, unspecified etiology    ED Discharge Orders         Ordered    benzonatate (TESSALON) 100 MG capsule  Every 8 hours     10/06/17 1241    fluticasone (FLONASE) 50 MCG/ACT nasal spray  Daily     10/06/17 1241    lidocaine (XYLOCAINE) 2 %  solution  As needed     10/06/17 1241           Dietrich Pates, PA-C 10/06/17 1304    Pricilla Loveless, MD 10/06/17 1503

## 2017-10-25 ENCOUNTER — Emergency Department (HOSPITAL_COMMUNITY): Payer: Self-pay

## 2017-10-25 ENCOUNTER — Emergency Department (HOSPITAL_COMMUNITY)
Admission: EM | Admit: 2017-10-25 | Discharge: 2017-10-25 | Disposition: A | Discharge status: Home / Self Care | Payer: Self-pay | Attending: Emergency Medicine | Admitting: Emergency Medicine

## 2017-10-25 ENCOUNTER — Other Ambulatory Visit: Payer: Self-pay

## 2017-10-25 ENCOUNTER — Encounter (HOSPITAL_COMMUNITY): Payer: Self-pay | Admitting: Emergency Medicine

## 2017-10-25 DIAGNOSIS — J45909 Unspecified asthma, uncomplicated: Secondary | ICD-10-CM | POA: Insufficient documentation

## 2017-10-25 DIAGNOSIS — N200 Calculus of kidney: Secondary | ICD-10-CM | POA: Insufficient documentation

## 2017-10-25 DIAGNOSIS — F17210 Nicotine dependence, cigarettes, uncomplicated: Secondary | ICD-10-CM | POA: Insufficient documentation

## 2017-10-25 LAB — URINALYSIS, ROUTINE W REFLEX MICROSCOPIC
BILIRUBIN URINE: NEGATIVE
Bacteria, UA: NONE SEEN
GLUCOSE, UA: NEGATIVE mg/dL
KETONES UR: 5 mg/dL — AB
NITRITE: NEGATIVE
PH: 5 (ref 5.0–8.0)
Protein, ur: 30 mg/dL — AB
RBC / HPF: >50 RBC/hpf — ABNORMAL HIGH (ref 0–5)
Specific Gravity, Urine: 1.027 (ref 1.005–1.030)

## 2017-10-25 LAB — CBC WITH DIFFERENTIAL/PLATELET
ABS IMMATURE GRANULOCYTES: 0.02 10*3/uL (ref 0.00–0.07)
BASOS ABS: 0 10*3/uL (ref 0.0–0.1)
Basophils Relative: 0 %
EOS ABS: 0.1 10*3/uL (ref 0.0–0.5)
Eosinophils Relative: 1 %
HCT: 49.9 % (ref 39.0–52.0)
Hemoglobin: 16.1 g/dL (ref 13.0–17.0)
IMMATURE GRANULOCYTES: 0 %
LYMPHS PCT: 27 %
Lymphs Abs: 2.1 10*3/uL (ref 0.7–4.0)
MCH: 29.6 pg (ref 26.0–34.0)
MCHC: 32.3 g/dL (ref 30.0–36.0)
MCV: 91.7 fL (ref 80.0–100.0)
Monocytes Absolute: 1.2 10*3/uL — ABNORMAL HIGH (ref 0.1–1.0)
Monocytes Relative: 15 %
NEUTROS PCT: 57 %
Neutro Abs: 4.4 10*3/uL (ref 1.7–7.7)
Platelets: 288 10*3/uL (ref 150–400)
RBC: 5.44 MIL/uL (ref 4.22–5.81)
RDW: 13 % (ref 11.5–15.5)
WBC: 7.8 10*3/uL (ref 4.0–10.5)
nRBC: 0 % (ref 0.0–0.2)

## 2017-10-25 LAB — BASIC METABOLIC PANEL
ANION GAP: 11 (ref 5–15)
BUN: 11 mg/dL (ref 6–20)
CALCIUM: 10.1 mg/dL (ref 8.9–10.3)
CO2: 28 mmol/L (ref 22–32)
Chloride: 103 mmol/L (ref 98–111)
Creatinine, Ser: 1.02 mg/dL (ref 0.61–1.24)
GFR calc Af Amer: >60 mL/min (ref >60)
GFR calc non Af Amer: >60 mL/min (ref >60)
Glucose, Bld: 91 mg/dL (ref 70–99)
Potassium: 4.3 mmol/L (ref 3.5–5.1)
Sodium: 142 mmol/L (ref 135–145)

## 2017-10-25 MED ORDER — TAMSULOSIN HCL 0.4 MG PO CAPS: 0.4000 mg | ORAL_CAPSULE | Freq: Every day | ORAL | 0 refills | Status: AC

## 2017-10-25 MED ORDER — HYDROCODONE-ACETAMINOPHEN 5-325 MG PO TABS: 1.0000 | ORAL_TABLET | Freq: Four times a day (QID) | ORAL | 0 refills | Status: AC | PRN

## 2017-10-25 NOTE — ED Notes (Signed)
Patient transported to CT 

## 2017-10-25 NOTE — ED Notes (Signed)
US at bedside

## 2017-10-25 NOTE — ED Notes (Signed)
No stones or pieces of stones noted when straining urine that was at the bedside.

## 2017-10-25 NOTE — ED Provider Notes (Signed)
[facility]-EMERGENCY DEPT Provider Note   CSN: [id number] Arrival date & time: 10/25/17  1653     History   Chief Complaint Chief Complaint  Patient presents with  . Abdominal Pain    RLQ  . Hematuria    HPI Glenn Davis is a 27 y.o. male no history of asthma, migraine, heart murmur who presents for evaluation of 3 days of intermittent right flank, right lower abdominal pain.  Patient also reports that he has had pain to the right testicle.  He states that 3 days ago, the pain started off as a sharp pain to the right back that radiated to the right front.  He states that it was not there all the time but would come and go.  No alleviating or aggravating factors.  He states that yesterday, he started noticing he had some right testicular pain felt like he felt a bump on the right testicle.  Did not notice any overlying warmth, erythema, swelling of the right testicle.  He denies any trauma, injury to the testicle.  He states that today, when he urinated, he noticed blood in his urine prompting ED visit.  He states that he has been able to eat and drink appropriately and has not had any nausea/vomiting.  He denies any fevers or chills at home.  Patient states that he is currently sexually active and reports he does use protection.  He has not noticed any penile discharge.  Patient denies any chest pain, difficulty breathing.   The history is provided by the patient.    Past Medical History:  Diagnosis Date  . Asthma   . Migraine   . Murmur     There are no active problems to display for this patient.   History reviewed. No pertinent surgical history.      Home Medications    Prior to Admission medications   Medication Sig Start Date End Date Taking? Authorizing Provider  acetaminophen (TYLENOL) 500 MG tablet Take 500 mg by mouth daily as needed for mild pain or moderate pain (migraine).    Yes [provider]  albuterol (PROVENTIL HFA;VENTOLIN  HFA) 108 (90 Base) MCG/ACT inhaler Inhale 1-2 puffs into the lungs every 6 (six) hours as needed for wheezing or shortness of breath. 04/20/17  Yes Hedges, Tinnie Gens, PA-C  ibuprofen (ADVIL,MOTRIN) 200 MG tablet Take 200 mg by mouth daily as needed (migraine).   Yes [provider]  benzonatate (TESSALON) 100 MG capsule Take 1 capsule (100 mg total) by mouth every 8 (eight) hours. Patient not taking: Reported on 10/25/2017 10/06/17   Dietrich Pates, PA-C  butalbital-acetaminophen-caffeine (FIORICET, ESGIC) 50-325-40 MG tablet Take 1-2 tablets by mouth every 6 (six) hours as needed for headache. Patient not taking: Reported on 10/25/2017 05/12/17 05/12/18  Liberty Handy, PA-C  fluticasone [facility]) 50 MCG/ACT nasal spray Place 1 spray into both nostrils daily. Patient not taking: Reported on 10/25/2017 10/06/17   Dietrich Pates, PA-C  HYDROcodone-acetaminophen (NORCO/VICODIN) 5-325 MG tablet Take 1-2 tablets by mouth every 6 (six) hours as needed. 10/25/17   Maxwell Caul, PA-C  lidocaine (XYLOCAINE) 2 % solution Use as directed 15 mLs in the mouth or throat as needed for mouth pain. Patient not taking: Reported on 10/25/2017 10/06/17   Dietrich Pates, PA-C  methocarbamol (ROBAXIN) 500 MG tablet Take 1 tablet (500 mg total) by mouth 2 (two) times daily. Patient not taking: Reported on 10/25/2017 06/23/17   Kellie Shropshire, PA-C  tamsulosin [facility]) 0.4  MG CAPS capsule Take 1 capsule (0.4 mg total) by mouth daily for 20 days. 10/25/17 11/14/17  Maxwell Caul, PA-C    Family History History reviewed. No pertinent family history.  Social History Social History   Tobacco Use  . Smoking status: Current Every Day Smoker    Packs/day: 0.50    Types: Cigarettes  . Smokeless tobacco: Never Used  Substance Use Topics  . Alcohol use: No  . Drug use: Never     Allergies   Patient has no known allergies.   Review of Systems Review of Systems  Constitutional: Negative for fever.    Respiratory: Negative for cough and shortness of breath.   Cardiovascular: Negative for chest pain.  Gastrointestinal: Positive for abdominal pain. Negative for nausea and vomiting.  Genitourinary: Positive for flank pain, hematuria and testicular pain. Negative for dysuria.  Neurological: Negative for headaches.  All other systems reviewed and are negative.    Physical Exam Updated Vital Signs BP 125/88 (BP Location: Left Arm)   Pulse 66   Temp 98.6 F (37 C) (Oral)   Resp 20   SpO2 98%   Physical Exam  Constitutional: He is oriented to person, place, and time. He appears well-developed and well-nourished.  HENT:  Head: Normocephalic and atraumatic.  Mouth/Throat: Oropharynx is clear and moist and mucous membranes are normal.  Eyes: Pupils are equal, round, and reactive to light. Conjunctivae, EOM and lids are normal.  Neck: Full passive range of motion without pain.  Cardiovascular: Normal rate, regular rhythm, normal heart sounds and normal pulses. Exam reveals no gallop and no friction rub.  No murmur heard. Pulmonary/Chest: Effort normal and breath sounds normal.  Lungs clear to auscultation bilaterally.  Symmetric chest rise.  No wheezing, rales, rhonchi.  Abdominal: Soft. Normal appearance. There is tenderness in the right lower quadrant. There is no rigidity, no guarding, no CVA tenderness and no tenderness at McBurney's point. Hernia confirmed negative in the right inguinal area and confirmed negative in the left inguinal area.  Abdomen is soft, nondistended.  Diffuse tenderness noted to the right lower quadrant.  No focal tenderness noted at McBurney's point.  No CVA tenderness bilaterally.  Genitourinary: Penis normal. Right testis shows no mass, no swelling and no tenderness. Left testis shows no mass, no swelling and no tenderness.  Genitourinary Comments: The exam was performed with a chaperone present. Normal male genitalia. No evidence of rash, ulcers or lesions.  No  palpable mass noted.  Musculoskeletal: Normal range of motion.  Neurological: He is alert and oriented to person, place, and time.  Skin: Skin is warm and dry. Capillary refill takes less than 2 seconds.  Psychiatric: He has a normal mood and affect. His speech is normal.  Nursing note and vitals reviewed.    ED Treatments / Results  Labs (all labs ordered are listed, but only abnormal results are displayed) Labs Reviewed  URINALYSIS, ROUTINE W REFLEX MICROSCOPIC - Abnormal; Notable for the following components:      Result Value   Hgb urine dipstick LARGE (*)    Ketones, ur 5 (*)    Protein, ur 30 (*)    Leukocytes, UA SMALL (*)    RBC / HPF >50 (*)    All other components within normal limits  CBC WITH DIFFERENTIAL/PLATELET - Abnormal; Notable for the following components:   Monocytes Absolute 1.2 (*)    All other components within normal limits  BASIC METABOLIC PANEL    EKG None  Radiology  Ct Renal Stone Study  Result Date: 10/25/2017 CLINICAL DATA:  Right lower abdominal/flank pain and hematuria. EXAM: CT ABDOMEN AND PELVIS WITHOUT CONTRAST TECHNIQUE: Multidetector CT imaging of the abdomen and pelvis was performed following the standard protocol without IV contrast. COMPARISON:  None. FINDINGS: Lower chest: No significant pulmonary nodules or acute consolidative airspace disease. Hepatobiliary: Normal liver size. No liver mass. Normal gallbladder with no radiopaque cholelithiasis. No biliary ductal dilatation. Pancreas: Normal, with no mass or duct dilation. Spleen: Normal size. No mass. Adrenals/Urinary Tract: Normal adrenals. Nonobstructing 2 mm interpolar right renal stone. No additional renal stones. No hydronephrosis. No contour deforming renal mass. Right ureterovesical junction 3 mm stone with minimal distal right ureterectasis. Normal caliber left ureter. No additional ureteral stones. Nondistended bladder with separate 3 mm posterior bladder stone. Stomach/Bowel:  Normal non-distended stomach. Normal caliber small bowel with no small bowel wall thickening. Normal appendix. Normal large bowel with no diverticulosis, large bowel wall thickening or pericolonic fat stranding. Vascular/Lymphatic: Normal caliber abdominal aorta. No pathologically enlarged lymph nodes in the abdomen or pelvis. Reproductive: Normal size prostate. Other: No pneumoperitoneum, ascites or focal fluid collection. Musculoskeletal: No aggressive appearing focal osseous lesions. IMPRESSION: 1. Right UVJ 3 mm stone with minimal distal right ureterectasis. No hydronephrosis. 2. Additional 3 mm stone in the posterior bladder. 3. Additional nonobstructing 2 mm interpolar right renal stone. Electronically Signed   By: Delbert Phenix M.D.   On: 10/25/2017 19:48   US Scrotum W/doppler  Result Date: 10/25/2017 CLINICAL DATA:  Right-sided testicular pain for 3 days. EXAM: SCROTAL ULTRASOUND DOPPLER ULTRASOUND OF THE TESTICLES TECHNIQUE: Complete ultrasound examination of the testicles, epididymis, and other scrotal structures was performed. Color and spectral Doppler ultrasound were also utilized to evaluate blood flow to the testicles. COMPARISON:  None. FINDINGS: Right testicle Measurements: 4.4 x 2.8 x 2.6 cm. No mass or microlithiasis visualized. Left testicle Measurements: 4.0 x 2.5 x 2.6 cm. No mass or microlithiasis visualized. Right epididymis:  Normal in size and appearance. Left epididymis:  Normal in size and appearance. Hydrocele:  None visualized. Varicocele: Small bilateral varicoceles are seen increased color Doppler blood flow during Valsalva maneuver. Pulsed Doppler interrogation of both testes demonstrates normal low resistance arterial and venous waveforms bilaterally. IMPRESSION: No evidence of testicular mass or torsion. Small bilateral varicoceles. Electronically Signed   By: Myles Rosenthal M.D.   On: 10/25/2017 20:08    Procedures Procedures (including critical care time)  Medications  Ordered in ED Medications - No data to display   Initial Impression / Assessment and Plan / ED Course  I have reviewed the triage vital signs and the nursing notes.  Pertinent labs & imaging results that were available during my care of the patient were reviewed by me and considered in my medical decision making (see chart for details).     27 year old male presents for evaluation of 3 days of intermittent right flank and right abdominal pain and hematuria that began today.  Also reports right testicular pain that began yesterday.  He felt like he noted a palpable mass.  No fevers, vomiting. Patient is afebrile, non-toxic appearing, sitting comfortably on examination table. Vital signs reviewed and stable.  On exam, he has some mild right lower quadrant tenderness.  No focal tenderness noted at McBurney's point.  No CVA tenderness.  Consider kidney stone versus UTI versus epididymitis versus orchitis.  Low suspicion for testicular torsion but given testicular pain, will plan for scrotal ultrasound.  Additionally, urine ordered at triage.  Will check plan to check basic labs, CT renal study for evaluation of kidney stone.  CBC shows no leukocytosis.  Normal hemoglobin.  BUN and creatinine are within normal limits.  UA shows large amount of hemoglobin, small leukocytes.  There is pyuria with 6-10 WBC.  CT renal stone shows 3 mm UVJ stone.  No hydronephrosis.  Is an additional 3 mm in the posterior bladder.  Additionally he has a nonobstructing stone in the right kidney.  Appendix is normal.  CT otherwise normal.  Scrotal ultrasound shows no evidence of testicular mass or torsion.  He has small bilateral varicoceles.  Discussed results with patient.  He has had improvement in pain since being here in the ED.  Vitals are stable.  Patient stable for discharge home.  We will plan to send home with her for urine, Flomax, pain medication.  Instructed patient to follow-up with urology. Patient had ample  opportunity for questions and discussion. All patient's questions were answered with full understanding. Strict return precautions discussed. Patient expresses understanding and agreement to plan.   Patient reviewed on PMP.  No recent narcotic prescriptions within the last year.   Final Clinical Impressions(s) / ED Diagnoses   Final diagnoses:  Kidney stone    ED Discharge Orders         Ordered    tamsulosin (FLOMAX) 0.4 MG CAPS capsule  Daily     10/25/17 2052    HYDROcodone-acetaminophen (NORCO/VICODIN) 5-325 MG tablet  Every 6 hours PRN     10/25/17 2052           Rosana Hoes 10/25/17 2138    Lorre Nick, MD 10/25/17 2236

## 2017-10-25 NOTE — ED Triage Notes (Signed)
Pt c/o sharp R lower abdominal pains that have been intermittent. Pt took Tylenol. Pt states he had hematuria since yesterday. Pt denies N/V/D.

## 2017-10-25 NOTE — Discharge Instructions (Signed)
Strain urine as directed.  Take Flomax as directed.  You can take Tylenol or ibuprofen for pain relief.  Take pain medication for severe breakthrough pain.  Follow-up with referred urologist doctor as directed.  Return to the emergency department for any worsening pain, persistent vomiting, fever or any other worsening or concerning symptoms.

## 2021-02-08 ENCOUNTER — Emergency Department (HOSPITAL_COMMUNITY)
Admission: EM | Admit: 2021-02-08 | Discharge: 2021-02-08 | Disposition: A | Discharge status: Home / Self Care | Payer: Self-pay | Attending: Emergency Medicine | Admitting: Emergency Medicine

## 2021-02-08 ENCOUNTER — Other Ambulatory Visit: Payer: Self-pay

## 2021-02-08 ENCOUNTER — Encounter (HOSPITAL_COMMUNITY): Payer: Self-pay | Admitting: Emergency Medicine

## 2021-02-08 ENCOUNTER — Emergency Department (HOSPITAL_COMMUNITY): Payer: Self-pay

## 2021-02-08 DIAGNOSIS — Z87442 Personal history of urinary calculi: Secondary | ICD-10-CM | POA: Insufficient documentation

## 2021-02-08 DIAGNOSIS — R109 Unspecified abdominal pain: Secondary | ICD-10-CM | POA: Insufficient documentation

## 2021-02-08 LAB — URINALYSIS, ROUTINE W REFLEX MICROSCOPIC
Bacteria, UA: NONE SEEN
Bilirubin Urine: NEGATIVE
Glucose, UA: NEGATIVE mg/dL
Ketones, ur: NEGATIVE mg/dL
Leukocytes,Ua: NEGATIVE
Nitrite: NEGATIVE
Protein, ur: NEGATIVE mg/dL
Specific Gravity, Urine: 1.013 (ref 1.005–1.030)
pH: 6 (ref 5.0–8.0)

## 2021-02-08 LAB — COMPREHENSIVE METABOLIC PANEL
ALT: 17 U/L (ref 0–44)
AST: 19 U/L (ref 15–41)
Albumin: 4.5 g/dL (ref 3.5–5.0)
Alkaline Phosphatase: 54 U/L (ref 38–126)
Anion gap: 10 (ref 5–15)
BUN: 17 mg/dL (ref 6–20)
CO2: 22 mmol/L (ref 22–32)
Calcium: 9.7 mg/dL (ref 8.9–10.3)
Chloride: 101 mmol/L (ref 98–111)
Creatinine, Ser: 0.75 mg/dL (ref 0.61–1.24)
GFR, Estimated: >60 mL/min (ref >60)
Glucose, Bld: 92 mg/dL (ref 70–99)
Potassium: 3.8 mmol/L (ref 3.5–5.1)
Sodium: 133 mmol/L — ABNORMAL LOW (ref 135–145)
Total Bilirubin: 0.5 mg/dL (ref 0.3–1.2)
Total Protein: 7.6 g/dL (ref 6.5–8.1)

## 2021-02-08 LAB — CBC WITH DIFFERENTIAL/PLATELET
Abs Immature Granulocytes: 0.02 10*3/uL (ref 0.00–0.07)
Basophils Absolute: 0 10*3/uL (ref 0.0–0.1)
Basophils Relative: 0 %
Eosinophils Absolute: 0.1 10*3/uL (ref 0.0–0.5)
Eosinophils Relative: 1 %
HCT: 49.3 % (ref 39.0–52.0)
Hemoglobin: 16.2 g/dL (ref 13.0–17.0)
Immature Granulocytes: 0 %
Lymphocytes Relative: 29 %
Lymphs Abs: 2.4 10*3/uL (ref 0.7–4.0)
MCH: 30.2 pg (ref 26.0–34.0)
MCHC: 32.9 g/dL (ref 30.0–36.0)
MCV: 91.8 fL (ref 80.0–100.0)
Monocytes Absolute: 1 10*3/uL (ref 0.1–1.0)
Monocytes Relative: 12 %
Neutro Abs: 4.8 10*3/uL (ref 1.7–7.7)
Neutrophils Relative %: 58 %
Platelets: 275 10*3/uL (ref 150–400)
RBC: 5.37 MIL/uL (ref 4.22–5.81)
RDW: 12.3 % (ref 11.5–15.5)
WBC: 8.2 10*3/uL (ref 4.0–10.5)
nRBC: 0 % (ref 0.0–0.2)

## 2021-02-08 LAB — LIPASE, BLOOD: Lipase: 23 U/L (ref 11–51)

## 2021-02-08 MED ORDER — CYCLOBENZAPRINE HCL 10 MG PO TABS: 10.0000 mg | ORAL_TABLET | Freq: Every evening | ORAL | 0 refills | Status: DC | PRN

## 2021-02-08 NOTE — ED Provider Notes (Signed)
[facility]-EMERGENCY DEPT Provider Note   CSN: [id number] Arrival date & time: 02/08/21  1905     History  Chief Complaint  Patient presents with   Flank Pain    Glenn Davis is a 31 y.o. male who presents today for evaluation of left flank pain.  This started 2 days ago.  He reports the pain has been worsening since.  He denies hematuria, fevers, nausea, or vomiting.  No aggravating or alleviating factors noted.  He does have a history of renal stones, thinks this may feel similar but is not sure.    HPI     Home Medications Prior to Admission medications   Medication Sig Start Date End Date Taking? Authorizing Provider  cyclobenzaprine (FLEXERIL) 10 MG tablet Take 1 tablet (10 mg total) by mouth at bedtime as needed for muscle spasms. 02/08/21  Yes Cristina Gong, PA-C  acetaminophen (TYLENOL) 500 MG tablet Take 500 mg by mouth daily as needed for mild pain or moderate pain (migraine).     [provider]  albuterol (PROVENTIL HFA;VENTOLIN HFA) 108 (90 Base) MCG/ACT inhaler Inhale 1-2 puffs into the lungs every 6 (six) hours as needed for wheezing or shortness of breath. 04/20/17   Hedges, Tinnie Gens, PA-C  benzonatate (TESSALON) 100 MG capsule Take 1 capsule (100 mg total) by mouth every 8 (eight) hours. Patient not taking: Reported on 10/25/2017 10/06/17   Dietrich Pates, PA-C  fluticasone (FLONASE) 50 MCG/ACT nasal spray Place 1 spray into both nostrils daily. Patient not taking: Reported on 10/25/2017 10/06/17   Dietrich Pates, PA-C  HYDROcodone-acetaminophen (NORCO/VICODIN) 5-325 MG tablet Take 1-2 tablets by mouth every 6 (six) hours as needed. 10/25/17   Maxwell Caul, PA-C  ibuprofen (ADVIL,MOTRIN) 200 MG tablet Take 200 mg by mouth daily as needed (migraine).    [provider]  lidocaine (XYLOCAINE) 2 % solution Use as directed 15 mLs in the mouth or throat as needed for mouth pain. Patient not taking: Reported on 10/25/2017 10/06/17    Dietrich Pates, PA-C      Allergies    Patient has no known allergies.    Review of Systems   Review of Systems See HPI Physical Exam Updated Vital Signs BP (!) 137/104    Pulse 79    Temp 98.5 F (36.9 C) (Oral)    Resp 18    Ht 5\' 5"  (1.651 m)    Wt 78.5 kg    SpO2 98%    BMI 28.79 kg/m  Physical Exam Vitals and nursing note reviewed.  Constitutional:      General: He is not in acute distress.    Appearance: He is not ill-appearing.  HENT:     Head: Normocephalic and atraumatic.  Eyes:     Conjunctiva/sclera: Conjunctivae normal.  Cardiovascular:     Rate and Rhythm: Normal rate.     Pulses: Normal pulses.     Heart sounds: Normal heart sounds.  Pulmonary:     Effort: Pulmonary effort is normal. No respiratory distress.  Abdominal:     General: There is no distension.     Tenderness: There is no abdominal tenderness. There is no right CVA tenderness or left CVA tenderness.  Musculoskeletal:     Cervical back: Normal range of motion and neck supple.     Comments: No obvious acute injury There is diffuse tenderness over the left flank/lower T/upper L paraspinal muscles..  Palpation here both recreates and exacerbates his reported pain.  Skin:  General: Skin is warm.  Neurological:     Mental Status: He is alert.     Comments: Awake and alert, answers all questions appropriately.  Speech is not slurred.  Psychiatric:        Mood and Affect: Mood normal.        Behavior: Behavior normal.      ED Results / Procedures / Treatments   Labs (all labs ordered are listed, but only abnormal results are displayed) Labs Reviewed  URINALYSIS, ROUTINE W REFLEX MICROSCOPIC - Abnormal; Notable for the following components:      Result Value   Color, Urine STRAW (*)    Hgb urine dipstick SMALL (*)    All other components within normal limits  COMPREHENSIVE METABOLIC PANEL - Abnormal; Notable for the following components:   Sodium 133 (*)    All other components within normal  limits  CBC WITH DIFFERENTIAL/PLATELET  LIPASE, BLOOD    EKG None  Radiology CT Renal Stone Study  Result Date: 02/08/2021 CLINICAL DATA:  Left flank pain x2 days EXAM: CT ABDOMEN AND PELVIS WITHOUT CONTRAST TECHNIQUE: Multidetector CT imaging of the abdomen and pelvis was performed following the standard protocol without IV contrast. RADIATION DOSE REDUCTION: This exam was performed according to the departmental dose-optimization program which includes automated exposure control, adjustment of the mA and/or kV according to patient size and/or use of iterative reconstruction technique. COMPARISON:  10/25/2017 FINDINGS: Lower chest: Lung bases are clear. Hepatobiliary: Unenhanced liver is unremarkable. Gallbladder is decompressed. No intrahepatic or extrahepatic ductal dilatation. Pancreas: Within normal limits. Spleen: Within normal limits. Adrenals/Urinary Tract: Adrenal glands are within normal limits. Kidneys are notable for a punctate nonobstructing right lower pole renal calculus (series 2/image 32). No hydronephrosis. Bladder is underdistended but unremarkable. Stomach/Bowel: Stomach is within normal limits. No evidence of bowel obstruction. Normal appendix (series 2/image 61). Vascular/Lymphatic: No evidence of abdominal aortic aneurysm. No suspicious abdominopelvic lymphadenopathy. Reproductive: Prostate is unremarkable. Other: No abdominopelvic ascites. Musculoskeletal: Visualized osseous structures are within normal limits. IMPRESSION: Punctate nonobstructing right lower pole renal calculus. No hydronephrosis. No CT findings to account for the patient's left flank pain. Electronically Signed   By: Julian Hy M.D.   On: 02/08/2021 20:06    Procedures Procedures    Medications Ordered in ED Medications - No data to display  ED Course/ Medical Decision Making/ A&P                           Medical Decision Making Amount and/or Complexity of Data Reviewed Labs:  ordered. Radiology: ordered.  Risk Prescription drug management.   Patient is a 31 year old man who presents today for evaluation of left-sided flank pain. His pain is been ongoing since yesterday.  He does have a history of kidney stones however is unsure if that is what this is.  CMP, CBC, lipase are all without significant electrolyte or hematologic derangements. Urine does show small blood, however microscopy shows no reds, no whites and no bacteria.   CT renal study is obtained with a right-sided punctate stone, however no cause for patient's symptoms are found. I am reassured that I am able to recreate and exacerbate his pain with superficial palpation that his pain is musculoskeletal in nature. He is PERC negative, does not have any chest pain and doubt PE/ACS. He denies any penile discharge or concern for STI. We discussed role of Flexeril, along with safe use of it, he wishes for this prescription  which is given. Recommended OTC meds, conservative care.  Outside records reviewed including UTI visit from 07/19/2019 through [facility]. No family or additional historians is otherwise available.  Return precautions were discussed with patient who states their understanding.  At the time of discharge patient denied any unaddressed complaints or concerns.  Patient is agreeable for discharge home.  Note: Portions of this report may have been transcribed using voice recognition software. Every effort was made to ensure accuracy; however, inadvertent computerized transcription errors may be present.      Final Clinical Impression(s) / ED Diagnoses Final diagnoses:  Left flank pain    Rx / DC Orders ED Discharge Orders          Ordered    cyclobenzaprine (FLEXERIL) 10 MG tablet  At bedtime PRN        02/08/21 2158              Lorin Glass, Hershal Coria 02/08/21 2218    Sherwood Gambler, MD 02/08/21 2318

## 2021-02-08 NOTE — Discharge Instructions (Addendum)
As we discussed your work up was reassuring today.  You do have a pinpoint stone in your right kidney.    If you develop any new or concerning symptoms including shortness of breath, chest pain, fevers, uncontrolled pain, inability to stay hydrated please seek additional medical care and evaluation/.   Please take Ibuprofen (Advil, motrin) and Tylenol (acetaminophen) to relieve your pain.    You may take up to 600 MG (3 pills) of normal strength ibuprofen every 8 hours as needed.   You make take tylenol, up to 1,000 mg (two extra strength pills) every 8 hours as needed.   It is safe to take ibuprofen and tylenol at the same time as they work differently.   Do not take more than 3,000 mg tylenol in a 24 hour period (not more than one dose every 8 hours.  Please check all medication labels as many medications such as pain and cold medications may contain tylenol.  Do not drink alcohol while taking these medications.  Do not take other NSAID'S while taking ibuprofen (such as aleve or naproxen).  Please take ibuprofen with food to decrease stomach upset.  You are being prescribed a medication which may make you sleepy. For 24 hours after one dose please do not drive, operate heavy machinery, care for a small child with out another adult present, or perform any activities that may cause harm to you or someone else if you were to fall asleep or be impaired.   The best way to get rid of muscle pain is by taking NSAIDS, using heat, massage therapy, and gentle stretching/range of motion exercises.

## 2021-02-08 NOTE — ED Provider Triage Note (Signed)
Emergency Medicine Provider Triage Evaluation Note  Glenn Davis , a 31 y.o. male  was evaluated in triage.  Pt complains of left sided flank pain.  This has been ongoing for about 1.5 days.   No hematuria, no fevers.    Physical Exam  BP (!) 137/104    Pulse 94    Temp 99.1 F (37.3 C)    Resp 20    SpO2 99%  Gen:   Awake, no distress   Resp:  Normal effort  MSK:   Moves extremities without difficulty  Other:  Left cva pain  Medical Decision Making  Medically screening exam initiated at 7:33 PM.  Appropriate orders placed.  Glenn Davis was informed that the remainder of the evaluation will be completed by another provider, this initial triage assessment does not replace that evaluation, and the importance of remaining in the ED until their evaluation is complete.     Lorin Glass, [place] 02/08/21 1936

## 2021-02-08 NOTE — ED Triage Notes (Signed)
Patient states left flank pain that began approx. 2 days ago. Patient states increase in pain over the last day. Patient states pain is worse than the first. Patient denies hematuria or fevers, nausea or vomiting. No relieving factors.

## 2021-04-10 ENCOUNTER — Encounter (HOSPITAL_COMMUNITY): Payer: Self-pay | Admitting: Emergency Medicine

## 2021-04-10 ENCOUNTER — Emergency Department (HOSPITAL_COMMUNITY)
Admission: EM | Admit: 2021-04-10 | Discharge: 2021-04-10 | Disposition: A | Discharge status: Home / Self Care | Payer: Self-pay | Attending: Emergency Medicine | Admitting: Emergency Medicine

## 2021-04-10 DIAGNOSIS — Z23 Encounter for immunization: Secondary | ICD-10-CM | POA: Insufficient documentation

## 2021-04-10 DIAGNOSIS — Y9259 Other trade areas as the place of occurrence of the external cause: Secondary | ICD-10-CM | POA: Insufficient documentation

## 2021-04-10 DIAGNOSIS — S6010XA Contusion of unspecified finger with damage to nail, initial encounter: Secondary | ICD-10-CM

## 2021-04-10 DIAGNOSIS — Y990 Civilian activity done for income or pay: Secondary | ICD-10-CM | POA: Insufficient documentation

## 2021-04-10 DIAGNOSIS — S60111A Contusion of right thumb with damage to nail, initial encounter: Secondary | ICD-10-CM | POA: Insufficient documentation

## 2021-04-10 DIAGNOSIS — W208XXA Other cause of strike by thrown, projected or falling object, initial encounter: Secondary | ICD-10-CM | POA: Insufficient documentation

## 2021-04-10 MED ORDER — BUPIVACAINE HCL (PF) 0.5 % IJ SOLN
10.0000 mL | Freq: Once | INTRAMUSCULAR | Status: AC
  Administered 2021-04-10: 10 mL
  Filled 2021-04-10: qty 30

## 2021-04-10 MED ORDER — TETANUS-DIPHTH-ACELL PERTUSSIS 5-2.5-18.5 LF-MCG/0.5 IM SUSY
0.5000 mL | PREFILLED_SYRINGE | Freq: Once | INTRAMUSCULAR | Status: AC
  Administered 2021-04-10: 0.5 mL via INTRAMUSCULAR
  Filled 2021-04-10: qty 0.5

## 2021-04-10 NOTE — ED Provider Notes (Signed)
?[facility]-EMERGENCY DEPT ?Provider Note ? ? ?CSN: [id number] ?Arrival date & time: 04/10/21  1433 ? ?  ? ?History ? ?Chief Complaint  ?Patient presents with  ? Finger Injury  ? ? ?Glenn Davis is a 31 y.o. male.  Patient presents with concerns of a possible piece of palate under his right thumbnail.  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  He thought he got the majority of it out but believes there is a piece still under the thumbnail at this time is concerned about infection. ? ?HPI ? ?  ? ?Home Medications ?Prior to Admission medications   ?Medication Sig Start Date End Date Taking? Authorizing Provider  ?acetaminophen (TYLENOL) 500 MG tablet Take 500 mg by mouth daily as needed for mild pain or moderate pain (migraine).     [provider]  ?albuterol (PROVENTIL HFA;VENTOLIN HFA) 108 (90 Base) MCG/ACT inhaler Inhale 1-2 puffs into the lungs every 6 (six) hours as needed for wheezing or shortness of breath. 04/20/17   Hedges, Tinnie Gens, PA-C  ?benzonatate (TESSALON) 100 MG capsule Take 1 capsule (100 mg total) by mouth every 8 (eight) hours. ?Patient not taking: Reported on 10/25/2017 10/06/17   Dietrich Pates, PA-C  ?cyclobenzaprine (FLEXERIL) 10 MG tablet Take 1 tablet (10 mg total) by mouth at bedtime as needed for muscle spasms. 02/08/21   Cristina Gong, PA-C  ?fluticasone (FLONASE) 50 MCG/ACT nasal spray Place 1 spray into both nostrils daily. ?Patient not taking: Reported on 10/25/2017 10/06/17   Dietrich Pates, PA-C  ?HYDROcodone-acetaminophen (NORCO/VICODIN) 5-325 MG tablet Take 1-2 tablets by mouth every 6 (six) hours as needed. 10/25/17   Maxwell Caul, PA-C  ?ibuprofen (ADVIL,MOTRIN) 200 MG tablet Take 200 mg by mouth daily as needed (migraine).    [provider]  ?lidocaine (XYLOCAINE) 2 % solution Use as directed 15 mLs in the mouth or throat as needed for mouth pain. ?Patient not  taking: Reported on 10/25/2017 10/06/17   Dietrich Pates, PA-C  ?   ? ?Allergies    ?Patient has no known allergies.   ? ?Review of Systems   ?Review of Systems  ?Skin:   ?     Possible splinter under right thumb nail  ? ?Physical Exam ?Updated Vital Signs ?BP (!) 147/93 (BP Location: Left Arm)   Pulse 86   Temp 98 ?F (36.7 ?C) (Oral)   Resp 18   SpO2 95%  ?Physical Exam ?Vitals reviewed.  ?Constitutional:   ?   General: He is not in acute distress. ?HENT:  ?   Head: Normocephalic.  ?Eyes:  ?   Conjunctiva/sclera: Conjunctivae normal.  ?Cardiovascular:  ?   Rate and Rhythm: Normal rate.  ?   Pulses: Normal pulses.  ?Pulmonary:  ?   Effort: Pulmonary effort is normal.  ?Abdominal:  ?   Palpations: Abdomen is soft.  ?Musculoskeletal:  ?   Cervical back: Normal range of motion.  ?Skin: ?   Comments: Spot noticed under right thumb nail splinter versus blood  ?Neurological:  ?   Mental Status: He is alert.  ? ? ?ED Results / Procedures / Treatments   ?Labs ?(all labs ordered are listed, but only abnormal results are displayed) ?Labs Reviewed - No data to display ? ?EKG ?None ? ?Radiology ?No results found. ? ?Procedures ?Procedures  ? ? ?Medications Ordered in ED ?Medications  ?bupivacaine(PF) (MARCAINE) 0.5 % injection 10 mL (  10 mLs Infiltration Given 04/10/21 1609)  ?Tdap (BOOSTRIX) injection 0.5 mL (0.5 mLs Intramuscular Given 04/10/21 1610)  ? ? ?ED Course/ Medical Decision Making/ A&P ?  ?                        ?Medical Decision Making ?Risk ?Prescription drug management. ? ? ?The patient presents with concerns of a possible splinter under the nail of the right thumb. It is unclear from inspection if there is a splinter or blood under the nail.  ? ?The patient gave consent for trephination to try to remove the possible splinter or evacuate the blood.  Ring block performed using 0.5% Marcaine.  Bovie used for trephination.  Small amount of blood evacuated.  No splinter noted.  The patient tolerated the procedure  well.  The patient is unaware of the last tetanus booster and was provided a tetanus shot today.  There is no indication for antibiotics at this time.  The patient is safe to discharge home. ? ?Final Clinical Impression(s) / ED Diagnoses ?Final diagnoses:  ?Subungual hematoma of digit of hand, initial encounter  ? ? ?Rx / DC Orders ?ED Discharge Orders   ? ? None  ? ?  ? ? ?  ?Darrick Grinder, PA-C ?04/10/21 1635 ? ?  ?Cathren Laine, MD ?04/10/21 1718 ? ?

## 2021-04-10 NOTE — Discharge Instructions (Addendum)
You were seen today for a possible splinter.  During the procedure no splinter was noted but a small amount of blood was evacuated.  You were administered a tetanus shot at today's visit.  Return if you develop life-threatening conditions. ?

## 2021-04-10 NOTE — ED Triage Notes (Signed)
Per pt, states he has a piece of a pallet under his right thumb nail at work last night-unable to get it out himself ?

## 2022-06-17 ENCOUNTER — Other Ambulatory Visit: Payer: Self-pay

## 2022-06-17 ENCOUNTER — Emergency Department (HOSPITAL_COMMUNITY)
Admission: EM | Admit: 2022-06-17 | Discharge: 2022-06-17 | Disposition: A | Discharge status: Home / Self Care | Payer: BC Managed Care – PPO | Attending: Student | Admitting: Student

## 2022-06-17 ENCOUNTER — Encounter (HOSPITAL_COMMUNITY): Payer: Self-pay

## 2022-06-17 DIAGNOSIS — Z20822 Contact with and (suspected) exposure to covid-19: Secondary | ICD-10-CM | POA: Insufficient documentation

## 2022-06-17 DIAGNOSIS — Z7951 Long term (current) use of inhaled steroids: Secondary | ICD-10-CM | POA: Insufficient documentation

## 2022-06-17 DIAGNOSIS — J069 Acute upper respiratory infection, unspecified: Secondary | ICD-10-CM | POA: Diagnosis not present

## 2022-06-17 DIAGNOSIS — J45909 Unspecified asthma, uncomplicated: Secondary | ICD-10-CM | POA: Diagnosis not present

## 2022-06-17 DIAGNOSIS — R059 Cough, unspecified: Secondary | ICD-10-CM | POA: Diagnosis present

## 2022-06-17 DIAGNOSIS — J029 Acute pharyngitis, unspecified: Secondary | ICD-10-CM

## 2022-06-17 LAB — RESP PANEL BY RT-PCR (RSV, FLU A&B, COVID)  RVPGX2
Influenza A by PCR: NEGATIVE
Influenza B by PCR: NEGATIVE
Resp Syncytial Virus by PCR: NEGATIVE
SARS Coronavirus 2 by RT PCR: NEGATIVE

## 2022-06-17 LAB — GROUP A STREP BY PCR: Group A Strep by PCR: NOT DETECTED

## 2022-06-17 MED ORDER — DEXAMETHASONE 4 MG PO TABS
6.0000 mg | ORAL_TABLET | Freq: Once | ORAL | Status: AC
  Administered 2022-06-17: 6 mg via ORAL
  Filled 2022-06-17: qty 1

## 2022-06-17 NOTE — ED Provider Notes (Signed)
[place] EMERGENCY DEPARTMENT AT [facility] Provider Note   CSN: [id number] Arrival date & time: 06/17/22  0935     History  Chief Complaint  Patient presents with   Sore Throat   Cough    Glenn Davis is a 32 y.o. male with past medical history significant for asthma, migraines, heart murmur presents to the ED complaining of cough, congestion, and sore throat for the past 2 days.  Patient states he has been taking Theraflu without relief of his symptoms.  He states he wears a mask at work and has no known sick contacts.  He has been able to eat and drink normally.  Patient reports some chest discomfort with coughing.  He does not have to use inhalers for his asthma. Denies chest tightness, shortness of breath, wheezing, fever, otalgia, rhinorrhea, trouble swallowing, voice changes, nausea, vomiting, diarrhea.         Home Medications Prior to Admission medications   Medication Sig Start Date End Date Taking? Authorizing Provider  acetaminophen (TYLENOL) 500 MG tablet Take 500 mg by mouth daily as needed for mild pain or moderate pain (migraine).     [provider]  albuterol (PROVENTIL HFA;VENTOLIN HFA) 108 (90 Base) MCG/ACT inhaler Inhale 1-2 puffs into the lungs every 6 (six) hours as needed for wheezing or shortness of breath. 04/20/17   Hedges, Tinnie Gens, PA-C  benzonatate (TESSALON) 100 MG capsule Take 1 capsule (100 mg total) by mouth every 8 (eight) hours. Patient not taking: Reported on 10/25/2017 10/06/17   Dietrich Pates, PA-C  cyclobenzaprine (FLEXERIL) 10 MG tablet Take 1 tablet (10 mg total) by mouth at bedtime as needed for muscle spasms. 02/08/21   Cristina Gong, PA-C  fluticasone [facility]) 50 MCG/ACT nasal spray Place 1 spray into both nostrils daily. Patient not taking: Reported on 10/25/2017 10/06/17   Dietrich Pates, PA-C  HYDROcodone-acetaminophen (NORCO/VICODIN) 5-325 MG tablet Take 1-2 tablets by mouth every 6 (six) hours as needed. 10/25/17    Maxwell Caul, PA-C  ibuprofen (ADVIL,MOTRIN) 200 MG tablet Take 200 mg by mouth daily as needed (migraine).    [provider]  lidocaine (XYLOCAINE) 2 % solution Use as directed 15 mLs in the mouth or throat as needed for mouth pain. Patient not taking: Reported on 10/25/2017 10/06/17   Dietrich Pates, PA-C      Allergies    Patient has no known allergies.    Review of Systems   Review of Systems  Constitutional:  Negative for fever.  HENT:  Positive for congestion and sore throat. Negative for ear pain, rhinorrhea, trouble swallowing and voice change.   Respiratory:  Positive for cough. Negative for chest tightness, shortness of breath and wheezing.   Cardiovascular:  Positive for chest pain (with coughing).  Gastrointestinal:  Negative for diarrhea, nausea and vomiting.    Physical Exam Updated Vital Signs BP (!) 145/93 (BP Location: Right Arm)   Pulse 77   Temp 98.2 F (36.8 C) (Oral)   Resp 16   Ht 5\' 4"  (1.626 m)   Wt 92.1 kg   SpO2 98%   BMI 34.85 kg/m  Physical Exam Vitals and nursing note reviewed.  Constitutional:      General: He is not in acute distress.    Appearance: He is not ill-appearing.  HENT:     Right Ear: Tympanic membrane and ear canal normal.     Left Ear: Tympanic membrane and ear canal normal.     Nose: Congestion present.  No rhinorrhea.     Mouth/Throat:     Lips: Pink.     Mouth: Mucous membranes are moist.     Pharynx: Oropharynx is clear. Uvula midline. Posterior oropharyngeal erythema present. No pharyngeal swelling, oropharyngeal exudate or uvula swelling.     Tonsils: No tonsillar exudate. 2+ on the right. 2+ on the left.     Comments: Posterior oropharynx with erythema and significant amount of post-nasal drip.  No tonsillar exudate appreciated.  Eyes:     Conjunctiva/sclera: Conjunctivae normal.     Pupils: Pupils are equal, round, and reactive to light.  Cardiovascular:     Rate and Rhythm: Normal rate and regular rhythm.      Pulses: Normal pulses.     Heart sounds: Normal heart sounds.  Pulmonary:     Effort: Pulmonary effort is normal. No respiratory distress.     Breath sounds: Normal breath sounds and air entry.  Abdominal:     General: Abdomen is flat. Bowel sounds are normal. There is no distension.     Palpations: Abdomen is soft.     Tenderness: There is no abdominal tenderness.  Musculoskeletal:     Cervical back: Full passive range of motion without pain and neck supple.  Lymphadenopathy:     Cervical: No cervical adenopathy.  Skin:    General: Skin is warm and dry.     Capillary Refill: Capillary refill takes less than 2 seconds.  Neurological:     Mental Status: He is alert. Mental status is at baseline.  Psychiatric:        Mood and Affect: Mood normal.        Behavior: Behavior normal.     ED Results / Procedures / Treatments   Labs (all labs ordered are listed, but only abnormal results are displayed) Labs Reviewed  GROUP A STREP BY PCR  RESP PANEL BY RT-PCR (RSV, FLU A&B, COVID)  [id number]    EKG None  Radiology No results found.  Procedures Procedures    Medications Ordered in ED Medications  dexamethasone (DECADRON) tablet 6 mg (has no administration in time range)    ED Course/ Medical Decision Making/ A&P                             Medical Decision Making  This patient presents to the ED with chief complaint(s) of sore throat, cough, congestion with pertinent past medical history of asthma.  The complaint involves an extensive differential diagnosis and also carries with it a high risk of complications and morbidity.    The differential diagnosis includes COVID, strep pharyngitis, influenza, RSV, adenovirus, other viral pharyngitis, tonsillitis   Initial Assessment:   On exam, patient is overall well-appearing 32 year old male.  He is not in acute distress.  Lungs clear to auscultation bilaterally with adequate tidal volume.  Normal pulmonary effort.  Heart  rate is normal in the 70s with regular rhythm.  Posterior oropharynx erythematous with significant amount of post-nasal drip.  Tonsils are 2+ without exudate.  No appreciable cervical lymphadenopathy  Independent Labs Interpretation: Group A strep negative.  Negative COVID, RSV, influenza.  Treatment and Reassessment: Patient given single dose of PO dexamethasone for pharyngitis.    Disposition:   Discussed supportive care measures and OTC medications patient may take for his symptoms.  Suspect his symptoms are from viral URI.  He does not require antibiotics at this time.  Recommended PCP follow up as  needed.  Work note provided.  The patient has been appropriately medically screened and/or stabilized in the ED. I have low suspicion for any other emergent medical condition which would require further screening, evaluation or treatment in the ED or require inpatient management. At time of discharge the patient is hemodynamically stable and in no acute distress. I have discussed work-up results and diagnosis with patient and answered all questions. Patient is agreeable with discharge plan. We discussed strict return precautions for returning to the emergency department and they verbalized understanding.             Final Clinical Impression(s) / ED Diagnoses Final diagnoses:  Viral URI with cough  Sore throat    Rx / DC Orders ED Discharge Orders     None         Lenard Simmer, PA-C 06/17/22 1106    Kommor, [place], MD 06/18/22 [id number]

## 2022-06-17 NOTE — Discharge Instructions (Addendum)
Thank you for allowing me to be a part of your care today.   You tested negative for strep, COVID, influenza, and RSV.  Your symptoms are likely related to another virus that will need to run its course.   You were given a single dose oral steroid while in the ED to help with throat pain.  I recommend taking 600-800 mg of ibuprofen every 6-8 hours as needed for fever, headache, body aches, etc.  You may alternate this with [phone] mg of Tylenol every 3-4 hours as needed.   Be sure to get plenty of rest and increase your fluid intake.  For nasal congestion, I recommend taking Zyrtec (cetirizine) daily and using saline nasal sprays to help thin out mucus.  For cough, I recommend Delsym cough syrup (a cough suppressant).   Follow up with your primary care provider as needed.  Return to the ED if you develop sudden worsening of your symptoms or if you have any new concerns.

## 2022-07-16 ENCOUNTER — Encounter (HOSPITAL_COMMUNITY): Payer: Self-pay | Admitting: Emergency Medicine

## 2022-07-16 ENCOUNTER — Emergency Department (HOSPITAL_COMMUNITY)
Admission: EM | Admit: 2022-07-16 | Discharge: 2022-07-16 | Disposition: A | Discharge status: Home / Self Care | Payer: BC Managed Care – PPO | Attending: Emergency Medicine | Admitting: Emergency Medicine

## 2022-07-16 ENCOUNTER — Other Ambulatory Visit: Payer: Self-pay

## 2022-07-16 DIAGNOSIS — J45909 Unspecified asthma, uncomplicated: Secondary | ICD-10-CM | POA: Diagnosis not present

## 2022-07-16 DIAGNOSIS — M545 Low back pain, unspecified: Secondary | ICD-10-CM | POA: Insufficient documentation

## 2022-07-16 DIAGNOSIS — R252 Cramp and spasm: Secondary | ICD-10-CM | POA: Diagnosis not present

## 2022-07-16 DIAGNOSIS — Z7951 Long term (current) use of inhaled steroids: Secondary | ICD-10-CM | POA: Diagnosis not present

## 2022-07-16 MED ORDER — TIZANIDINE HCL 4 MG PO TABS: 4.0000 mg | ORAL_TABLET | Freq: Four times a day (QID) | ORAL | 0 refills | Status: AC | PRN

## 2022-07-16 MED ORDER — ACETAMINOPHEN 500 MG PO TABS
1000.0000 mg | ORAL_TABLET | Freq: Once | ORAL | Status: AC
  Administered 2022-07-16: 1000 mg via ORAL
  Filled 2022-07-16: qty 2

## 2022-07-16 MED ORDER — LIDOCAINE 5 % EX PTCH
1.0000 | MEDICATED_PATCH | CUTANEOUS | Status: DC
  Administered 2022-07-16: 1 via TRANSDERMAL
  Filled 2022-07-16: qty 1

## 2022-07-16 MED ORDER — IBUPROFEN 200 MG PO TABS
600.0000 mg | ORAL_TABLET | Freq: Once | ORAL | Status: AC
  Administered 2022-07-16: 600 mg via ORAL
  Filled 2022-07-16: qty 3

## 2022-07-16 NOTE — Discharge Instructions (Addendum)
You were seen in the ER today for evaluation of your lower back pain.  This is likely musculoskeletal.  I am prescribing you some muscle laxer's to take as needed.  Please do not drive or operate machinery while on this medication as can make you sleepy.  I recommend taking 600 mg of ibuprofen and or 1000 mg of Tylenol every 6 hours as needed for pain.  I recommend picking up some over-the-counter lidocaine patches for your back as well.  If you have any concerns, new or worsening symptoms, please return to the nearest emergency department for evaluation.  Contact a health care provider if: You have pain that is not relieved with rest or medicine. You have increasing pain going down into your legs or buttocks. Your pain does not improve after 2 weeks. You have pain at night. You lose weight without trying. You have a fever or chills. You develop nausea or vomiting. You develop abdominal pain. Get help right away if: You develop new bowel or bladder control problems. You have unusual weakness or numbness in your arms or legs. You feel faint. These symptoms may represent a serious problem that is an emergency. Do not wait to see if the symptoms will go away. Get medical help right away. Call your local emergency services (911 in the [place] Do not drive yourself to the hospital.

## 2022-07-16 NOTE — ED Provider Notes (Signed)
[place] EMERGENCY DEPARTMENT AT [facility] Provider Note   CSN: [id number] Arrival date & time: 07/16/22  1509     History Chief Complaint  Patient presents with   Spasms    Glenn Davis is a 32 y.o. male with history of asthma presents emerged from today for evaluation of left lower back pain for the past 3 days.  Patient reports he does a lot of heavy lifting for his job and thinks that he may have hurt it doing that.  He has tried 800 mg once a day however ran out of his medications on Tuesday did not take any medication today.  He reports that he woke up this morning did not have any improvement on this pain and wanted to come to the ER to see if there is any other options for pain management.  He denies any IV drug use, urinary incontinence, fecal incontinence, saddle anesthesia, fevers, cancer history, numbness feeling down his leg, or any radiation of his pain.  Denies any trouble walking.  No known drug allergies.  Daily vape use.  Denies any EtOH or illicit drug use.  HPI     Home Medications Prior to Admission medications   Medication Sig Start Date End Date Taking? Authorizing Provider  acetaminophen (TYLENOL) 500 MG tablet Take 500 mg by mouth daily as needed for mild pain or moderate pain (migraine).     [provider]  albuterol (PROVENTIL HFA;VENTOLIN HFA) 108 (90 Base) MCG/ACT inhaler Inhale 1-2 puffs into the lungs every 6 (six) hours as needed for wheezing or shortness of breath. 04/20/17   Hedges, Tinnie Gens, PA-C  benzonatate (TESSALON) 100 MG capsule Take 1 capsule (100 mg total) by mouth every 8 (eight) hours. Patient not taking: Reported on 10/25/2017 10/06/17   Dietrich Pates, PA-C  cyclobenzaprine (FLEXERIL) 10 MG tablet Take 1 tablet (10 mg total) by mouth at bedtime as needed for muscle spasms. 02/08/21   Cristina Gong, PA-C  fluticasone [facility]) 50 MCG/ACT nasal spray Place 1 spray into both nostrils daily. Patient not taking: Reported on  10/25/2017 10/06/17   Dietrich Pates, PA-C  HYDROcodone-acetaminophen (NORCO/VICODIN) 5-325 MG tablet Take 1-2 tablets by mouth every 6 (six) hours as needed. 10/25/17   Maxwell Caul, PA-C  ibuprofen (ADVIL,MOTRIN) 200 MG tablet Take 200 mg by mouth daily as needed (migraine).    [provider]  lidocaine (XYLOCAINE) 2 % solution Use as directed 15 mLs in the mouth or throat as needed for mouth pain. Patient not taking: Reported on 10/25/2017 10/06/17   Dietrich Pates, PA-C      Allergies    Patient has no known allergies.    Review of Systems   Review of Systems  Constitutional:  Negative for chills and fever.  Respiratory:  Negative for shortness of breath.   Cardiovascular:  Negative for chest pain.  Gastrointestinal:  Negative for abdominal distention, constipation, diarrhea, nausea and vomiting.       Denies any fecal incontinence  Genitourinary:  Negative for dysuria and hematuria.       Denies any urinary incontinence or urinary retention.  Musculoskeletal:  Positive for back pain.       Denies any saddle anesthesia  Neurological:  Negative for weakness and numbness.    Physical Exam Updated Vital Signs BP 127/86 (BP Location: Right Arm)   Pulse 96   Temp 98 F (36.7 C) (Oral)   Resp 18   Ht 5\' 4"  (1.626 m)   Wt  88.5 kg   SpO2 100%   BMI 33.47 kg/m  Physical Exam Vitals and nursing note reviewed.  Constitutional:      Appearance: Normal appearance.  Eyes:     General: No scleral icterus. Pulmonary:     Effort: Pulmonary effort is normal. No respiratory distress.  Musculoskeletal:       Back:     Right lower leg: No edema.     Left lower leg: No edema.     Comments: Tenderness palpation upon the above marked area.  No overlying skin changes noted to the area.  No overlying warmth, erythema, induration, or fluctuance noted.  Tenderness diffusely upon palpation.  No midline cervical, thoracic, lumbar dislocation.  No step-offs or deformities.  Strength is  5 of 5 in patient's lower extremities.  Sensation intact bilaterally in lower extremities with palpable DP and PT pulses.  Compartments are soft.  No unilateral swelling noted.  No lower extremity edema appreciated as well.  Neurological:     General: No focal deficit present.     Mental Status: He is alert. Mental status is at baseline.     Sensory: No sensory deficit.     Motor: No weakness.  Psychiatric:        Mood and Affect: Mood normal.     ED Results / Procedures / Treatments   Labs (all labs ordered are listed, but only abnormal results are displayed) Labs Reviewed - No data to display  EKG None  Radiology No results found.  Procedures Procedures   Medications Ordered in ED Medications  lidocaine (LIDODERM) 5 % 1 patch (1 patch Transdermal Patch Applied 07/16/22 1644)  ibuprofen (ADVIL) tablet 600 mg (600 mg Oral Given 07/16/22 1644)  acetaminophen (TYLENOL) tablet 1,000 mg (1,000 mg Oral Given 07/16/22 1643)    ED Course/ Medical Decision Making/ A&P                           Medical Decision Making  33 y.o. male presents to the ER today for evaluation of lower left back pain. Differential diagnosis includes but is not limited to Fracture (acute/chronic), muscle strain, cauda equina, spinal stenosis, DDD, ligamentous injury, disk herniation, metastatic cancer, vertebral osteomyelitis, kidney stone, pyelonephritis, AAA, pancreatitis, bowel obstruction, meningitis. Vital signs unremarkable. Physical exam as noted above.   I do not think any imaging is needed at this time because he denies any blunt trauma, nor does he have any midline tenderness. No overlying skin changes noted.  He does not have any fever or any history of IV drug use, doubt any epidural abscess.  No fever or any confusion or new fluid symptoms, doubt any meningitis.  He is not having any incontinence or any other red flag symptoms, doubt any cauda equina.  Story does not counts consistent with any  pyelonephritis, AAA, pancreatitis, or bowel obstruction. This is likely muscular pain. The patient declines any shots, but will order him some Tylenol and ibuprofen and lidocaine patches. Will provide him a work note as well.   We discussed plan at bedside. We discussed strict return precautions and red flag symptoms. The patient verbalized their understanding and agrees to the plan. The patient is stable and being discharged home in good condition.  Portions of this report may have been transcribed using voice recognition software. Every effort was made to ensure accuracy; however, inadvertent computerized transcription errors may be present.   Final Clinical Impression(s) / ED Diagnoses Final  diagnoses:  None    Rx / DC Orders ED Discharge Orders     None         Achille Rich, PA-C 07/16/22 1649    Edwin Dada P, DO 07/20/22 [phone]

## 2022-07-16 NOTE — ED Triage Notes (Signed)
Pt endorses left side back spasms for a few days. Reports heavy lifting at work. No relief with OTC meds.

## 2022-08-10 ENCOUNTER — Emergency Department (HOSPITAL_COMMUNITY)
Admission: EM | Admit: 2022-08-10 | Discharge: 2022-08-11 | Disposition: A | Discharge status: Home / Self Care | Payer: BC Managed Care – PPO | Attending: Emergency Medicine | Admitting: Emergency Medicine

## 2022-08-10 ENCOUNTER — Other Ambulatory Visit: Payer: Self-pay

## 2022-08-10 ENCOUNTER — Encounter (HOSPITAL_COMMUNITY): Payer: Self-pay

## 2022-08-10 DIAGNOSIS — M545 Low back pain, unspecified: Secondary | ICD-10-CM | POA: Diagnosis present

## 2022-08-10 DIAGNOSIS — X500XXA Overexertion from strenuous movement or load, initial encounter: Secondary | ICD-10-CM | POA: Diagnosis not present

## 2022-08-10 NOTE — ED Triage Notes (Signed)
Pt. Arrives pov stating that his back keeps locking up on him. He states that he does a lot of heavy lifting at work. He takes ibuprofen for his pain, but it does not last long. He states that the feeling is now unbearable.

## 2022-08-11 ENCOUNTER — Emergency Department (HOSPITAL_COMMUNITY): Payer: BC Managed Care – PPO

## 2022-08-11 LAB — URINALYSIS, ROUTINE W REFLEX MICROSCOPIC
Bilirubin Urine: NEGATIVE
Glucose, UA: NEGATIVE mg/dL
Hgb urine dipstick: NEGATIVE
Ketones, ur: NEGATIVE mg/dL
Leukocytes,Ua: NEGATIVE
Nitrite: NEGATIVE
Protein, ur: NEGATIVE mg/dL
Specific Gravity, Urine: 1.028 (ref 1.005–1.030)
pH: 5 (ref 5.0–8.0)

## 2022-08-11 MED ORDER — KETOROLAC TROMETHAMINE 30 MG/ML IJ SOLN
15.0000 mg | Freq: Once | INTRAMUSCULAR | Status: AC
  Administered 2022-08-11: 15 mg via INTRAMUSCULAR
  Filled 2022-08-11: qty 1

## 2022-08-11 MED ORDER — HYDROCODONE-ACETAMINOPHEN 5-325 MG PO TABS
1.0000 | ORAL_TABLET | Freq: Once | ORAL | Status: AC
  Administered 2022-08-11: 1 via ORAL
  Filled 2022-08-11: qty 1

## 2022-08-11 MED ORDER — METHOCARBAMOL 500 MG PO TABS: 500.0000 mg | ORAL_TABLET | Freq: Three times a day (TID) | ORAL | 0 refills | Status: AC | PRN

## 2022-08-11 MED ORDER — NAPROXEN 500 MG PO TABS: 500.0000 mg | ORAL_TABLET | Freq: Two times a day (BID) | ORAL | 0 refills | Status: AC | PRN

## 2022-08-11 MED ORDER — METHOCARBAMOL 500 MG PO TABS
500.0000 mg | ORAL_TABLET | Freq: Once | ORAL | Status: AC
  Administered 2022-08-11: 500 mg via ORAL
  Filled 2022-08-11: qty 1

## 2022-08-11 NOTE — Discharge Instructions (Signed)
Take the anti-inflammatories and muscle relaxers as prescribed.  Do not lift more than 10 pounds until cleared by your primary doctor.  Return to the ED for worsening pain, fever, weakness, numbness, tingling, bowel or bladder incontinence or any other concerns.

## 2022-08-11 NOTE — ED Provider Notes (Signed)
[place] EMERGENCY DEPARTMENT AT [facility] Provider Note   CSN: [id number] Arrival date & time: 08/10/22  2104     History  No chief complaint on file.   Glenn Davis is a 32 y.o. male.  Patient reports "back locking up on me".  Reports right-sided low back pain for the past 2 days.  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  Taking ibuprofen without relief.  Pain is to his right low back.  No radiation of the pain down his leg.  No focal weakness, numbness or tingling.  No bowel or bladder incontinence.  No fever or vomiting.  No pain with urination or blood in the urine.  No history of back problems.  Has had a kidney stone in the past but this feels different. No history of IV drug abuse or cancer.  No chest pain or upper back pain  The history is provided by the patient.       Home Medications Prior to Admission medications   Medication Sig Start Date End Date Taking? Authorizing Provider  acetaminophen (TYLENOL) 500 MG tablet Take 500 mg by mouth daily as needed for mild pain or moderate pain (migraine).     [provider]  albuterol (PROVENTIL HFA;VENTOLIN HFA) 108 (90 Base) MCG/ACT inhaler Inhale 1-2 puffs into the lungs every 6 (six) hours as needed for wheezing or shortness of breath. 04/20/17   Hedges, Tinnie Gens, PA-C  benzonatate (TESSALON) 100 MG capsule Take 1 capsule (100 mg total) by mouth every 8 (eight) hours. Patient not taking: Reported on 10/25/2017 10/06/17   Dietrich Pates, PA-C  fluticasone (FLONASE) 50 MCG/ACT nasal spray Place 1 spray into both nostrils daily. Patient not taking: Reported on 10/25/2017 10/06/17   Dietrich Pates, PA-C  HYDROcodone-acetaminophen (NORCO/VICODIN) 5-325 MG tablet Take 1-2 tablets by mouth every 6 (six) hours as needed. 10/25/17   Maxwell Caul, PA-C  ibuprofen (ADVIL,MOTRIN) 200 MG tablet Take 200 mg by mouth daily as needed (migraine).    [provider]  lidocaine (XYLOCAINE) 2 % solution Use as directed 15 mLs in the mouth or throat as needed for mouth pain. Patient not taking: Reported on 10/25/2017 10/06/17   Dietrich Pates, PA-C  tiZANidine (ZANAFLEX) 4 MG tablet Take 1 tablet (4 mg total) by mouth every 6 (six) hours as needed for muscle spasms. 07/16/22   Achille Rich, PA-C      Allergies    Patient has no known allergies.    Review of Systems   Review of Systems  Constitutional:  Negative for activity change, appetite change and fever.  HENT:  Negative for congestion and rhinorrhea.   Respiratory:  Negative for cough, chest tightness and shortness of breath.   Gastrointestinal:  Negative for abdominal pain, nausea and vomiting.  Genitourinary:  Negative for dysuria and hematuria.  Musculoskeletal:  Positive for arthralgias, back pain and myalgias.  Skin:  Negative for pallor.  Neurological:  Negative for dizziness, weakness and headaches.   all other systems are negative except as noted in the HPI and PMH.    Physical Exam Updated Vital Signs BP (!) 154/130 (BP Location: Right Arm)   Pulse 91   Temp 98.2 F (36.8 C) (Oral)   Resp 18   Ht 5\' 4"  (1.626 m)   Wt 83.9 kg   SpO2 96%   BMI 31.76 kg/m  Physical Exam Vitals and nursing note reviewed.  Constitutional:  General: He is not in acute distress.    Appearance: He is well-developed.  HENT:     Head: Normocephalic and atraumatic.     Mouth/Throat:     Pharynx: No oropharyngeal exudate.  Eyes:     Conjunctiva/sclera: Conjunctivae normal.     Pupils: Pupils are equal, round, and reactive to light.  Neck:     Comments: No meningismus. Cardiovascular:     Rate and Rhythm: Normal rate and regular rhythm.     Heart sounds: Normal heart sounds. No murmur heard. Pulmonary:     Effort: Pulmonary effort is normal. No respiratory distress.     Breath sounds: Normal breath sounds.  Abdominal:     Palpations: Abdomen is soft.     Tenderness: There is no abdominal  tenderness. There is no guarding or rebound.  Musculoskeletal:        General: Tenderness present. Normal range of motion.     Cervical back: Normal range of motion and neck supple.     Comments: Right lumbar spine tenderness, no midline tenderness  5/5 strength in bilateral lower extremities. Ankle plantar and dorsiflexion intact. Great toe extension intact bilaterally. +2 DP and PT pulses. +2 patellar reflexes bilaterally. Normal gait.   Skin:    General: Skin is warm.  Neurological:     Mental Status: He is alert and oriented to person, place, and time.     Cranial Nerves: No cranial nerve deficit.     Motor: No abnormal muscle tone.     Coordination: Coordination normal.     Comments:  5/5 strength throughout. CN 2-12 intact.Equal grip strength.   Psychiatric:        Behavior: Behavior normal.     ED Results / Procedures / Treatments   Labs (all labs ordered are listed, but only abnormal results are displayed) Labs Reviewed  URINALYSIS, ROUTINE W REFLEX MICROSCOPIC    EKG None  Radiology CT Renal Stone Study  Result Date: 08/11/2022 CLINICAL DATA:  Backache locking up. EXAM: CT ABDOMEN AND PELVIS WITHOUT CONTRAST TECHNIQUE: Multidetector CT imaging of the abdomen and pelvis was performed following the standard protocol without IV contrast. RADIATION DOSE REDUCTION: This exam was performed according to the departmental dose-optimization program which includes automated exposure control, adjustment of the mA and/or kV according to patient size and/or use of iterative reconstruction technique. COMPARISON:  CT 02/08/2021 FINDINGS: Lower chest: No acute abnormality. Hepatobiliary: Unremarkable liver. Normal gallbladder. No biliary dilation. Pancreas: Unremarkable. Spleen: Unremarkable. Adrenals/Urinary Tract: Normal adrenal glands. Punctate nonobstructing calyceal stones bilaterally. No hydronephrosis. Bladder is unremarkable. Stomach/Bowel: Normal caliber large and small bowel. No  bowel wall thickening. The appendix is normal.Stomach is within normal limits. Vascular/Lymphatic: No significant vascular findings are present. No enlarged abdominal or pelvic lymph nodes. Reproductive: Unremarkable. Other: No free intraperitoneal fluid or air. Musculoskeletal: No acute fracture. IMPRESSION: 1. No acute abnormality in the abdomen or pelvis. 2. Punctate nonobstructing calyceal stones bilaterally. No hydronephrosis. Electronically Signed   By: Minerva Fester M.D.   On: 08/11/2022 02:13    Procedures Procedures    Medications Ordered in ED Medications  methocarbamol (ROBAXIN) tablet 500 mg (has no administration in time range)  ketorolac (TORADOL) 30 MG/ML injection 15 mg (has no administration in time range)  HYDROcodone-acetaminophen (NORCO/VICODIN) 5-325 MG per tablet 1 tablet (has no administration in time range)    ED Course/ Medical Decision Making/ A&P  Medical Decision Making Amount and/or Complexity of Data Reviewed Labs: ordered. Decision-making details documented in ED Course. Radiology: ordered and independent interpretation performed. Decision-making details documented in ED Course. ECG/medicine tests: ordered and independent interpretation performed. Decision-making details documented in ED Course.  Risk Prescription drug management.   2 days of low back pain after lifting.  Neurovascularly intact.  Hypertensive on arrival with no history of same.  Low suspicion for cord compression or cauda equina.  Intact distal strength, sensation, pulses and reflexes  Urinalysis negative for hematuria or infection.  CT scan above is negative for ureteral stone but does show some stones in the kidneys bilaterally.  Results reviewed and interpreted by me.  Suspect likely musculoskeletal back pain.  Will treat supportively with anti-inflammatories and muscle relaxers.  Limit lifting to no more than 10 pounds.  Work note to be given.   Follow-up with his doctor.  Return to the ED with worsening pain, weakness, numbness, tingling, bowel or bladder incontinence or other concerns.  Blood pressure has improved to 131/99.        Final Clinical Impression(s) / ED Diagnoses Final diagnoses:  None    Rx / DC Orders ED Discharge Orders     None         Phyllis Abelson, Jeannett Senior, MD 08/11/22 0222

## 2022-08-29 ENCOUNTER — Emergency Department (HOSPITAL_COMMUNITY)
Admission: EM | Admit: 2022-08-29 | Discharge: 2022-08-29 | Disposition: A | Discharge status: Home / Self Care | Payer: BC Managed Care – PPO | Source: Home / Self Care | Attending: Emergency Medicine | Admitting: Emergency Medicine

## 2022-08-29 ENCOUNTER — Encounter (HOSPITAL_COMMUNITY): Payer: Self-pay

## 2022-08-29 ENCOUNTER — Other Ambulatory Visit: Payer: Self-pay

## 2022-08-29 DIAGNOSIS — H53149 Visual discomfort, unspecified: Secondary | ICD-10-CM | POA: Diagnosis not present

## 2022-08-29 DIAGNOSIS — R110 Nausea: Secondary | ICD-10-CM | POA: Insufficient documentation

## 2022-08-29 DIAGNOSIS — R519 Headache, unspecified: Secondary | ICD-10-CM | POA: Diagnosis not present

## 2022-08-29 DIAGNOSIS — J45909 Unspecified asthma, uncomplicated: Secondary | ICD-10-CM | POA: Diagnosis not present

## 2022-08-29 MED ORDER — METOCLOPRAMIDE HCL 5 MG/ML IJ SOLN
10.0000 mg | Freq: Once | INTRAMUSCULAR | Status: AC
  Administered 2022-08-29: 10 mg via INTRAVENOUS
  Filled 2022-08-29: qty 2

## 2022-08-29 MED ORDER — KETOROLAC TROMETHAMINE 15 MG/ML IJ SOLN
15.0000 mg | Freq: Once | INTRAMUSCULAR | Status: AC
  Administered 2022-08-29: 15 mg via INTRAVENOUS
  Filled 2022-08-29: qty 1

## 2022-08-29 MED ORDER — DIPHENHYDRAMINE HCL 50 MG/ML IJ SOLN
25.0000 mg | Freq: Once | INTRAMUSCULAR | Status: AC
  Administered 2022-08-29: 25 mg via INTRAVENOUS
  Filled 2022-08-29: qty 1

## 2022-08-29 MED ORDER — SODIUM CHLORIDE 0.9 % IV BOLUS
1000.0000 mL | Freq: Once | INTRAVENOUS | Status: AC
  Administered 2022-08-29: 1000 mL via INTRAVENOUS

## 2022-08-29 NOTE — Discharge Instructions (Addendum)
Please take tylenol/ibuprofen every 6 hours for pain. I recommend close follow-up with your PCP or neurology for reevaluation.  Please do not hesitate to return to emergency department if worrisome signs symptoms we discussed become apparent.

## 2022-08-29 NOTE — ED Provider Notes (Signed)
[place] EMERGENCY DEPARTMENT AT [facility] Provider Note   CSN: [id number] Arrival date & time: 08/29/22  1121     History  Chief Complaint  Patient presents with   Migraine    Glenn Davis is a 32 y.o. male history of asthma, migraines and today for evaluation of headache.  Patient states symptom started yesterday.  Pain is on the left side of his forehead, nondraining.  Endorses photophobia.  Endorses nausea without vomiting.  Patient has tried OTC medication for pain with no relief.  He denies fever, chest pain, shortness of breath, bowel changes, urinary symptoms, abdominal pain.   Migraine    Past Medical History:  Diagnosis Date   Asthma    Migraine    Murmur    History reviewed. No pertinent surgical history.   Home Medications Prior to Admission medications   Medication Sig Start Date End Date Taking? Authorizing Provider  acetaminophen (TYLENOL) 500 MG tablet Take 500 mg by mouth daily as needed for mild pain or moderate pain (migraine).     [provider]  albuterol (PROVENTIL HFA;VENTOLIN HFA) 108 (90 Base) MCG/ACT inhaler Inhale 1-2 puffs into the lungs every 6 (six) hours as needed for wheezing or shortness of breath. 04/20/17   Hedges, Tinnie Gens, PA-C  benzonatate (TESSALON) 100 MG capsule Take 1 capsule (100 mg total) by mouth every 8 (eight) hours. Patient not taking: Reported on 10/25/2017 10/06/17   Dietrich Pates, PA-C  fluticasone (FLONASE) 50 MCG/ACT nasal spray Place 1 spray into both nostrils daily. Patient not taking: Reported on 10/25/2017 10/06/17   Dietrich Pates, PA-C  HYDROcodone-acetaminophen (NORCO/VICODIN) 5-325 MG tablet Take 1-2 tablets by mouth every 6 (six) hours as needed. 10/25/17   Maxwell Caul, PA-C  ibuprofen (ADVIL,MOTRIN) 200 MG tablet Take 200 mg by mouth daily as needed (migraine).    [provider]  lidocaine (XYLOCAINE) 2 % solution Use as directed 15 mLs in the mouth or throat as needed for mouth  pain. Patient not taking: Reported on 10/25/2017 10/06/17   Dietrich Pates, PA-C  methocarbamol (ROBAXIN) 500 MG tablet Take 1 tablet (500 mg total) by mouth every 8 (eight) hours as needed for muscle spasms. 08/11/22   Rancour, Jeannett Senior, MD  naproxen (NAPROSYN) 500 MG tablet Take 1 tablet (500 mg total) by mouth 2 (two) times daily as needed. 08/11/22   Rancour, Jeannett Senior, MD  tiZANidine (ZANAFLEX) 4 MG tablet Take 1 tablet (4 mg total) by mouth every 6 (six) hours as needed for muscle spasms. 07/16/22   Achille Rich, PA-C      Allergies    Patient has no known allergies.    Review of Systems   Review of Systems Negative except as per HPI.  Physical Exam Updated Vital Signs BP (!) 126/99 (BP Location: Left Arm)   Pulse 92   Temp 98 F (36.7 C) (Oral)   Resp 16   Ht 5\' 5"  (1.651 m)   Wt 88.5 kg   SpO2 97%   BMI 32.45 kg/m  Physical Exam Vitals and nursing note reviewed.  Constitutional:      Appearance: Normal appearance.  HENT:     Head: Normocephalic and atraumatic.     Mouth/Throat:     Mouth: Mucous membranes are moist.  Eyes:     General: No scleral icterus. Cardiovascular:     Rate and Rhythm: Normal rate and regular rhythm.     Pulses: Normal pulses.     Heart sounds: Normal heart sounds.  Pulmonary:     Effort: Pulmonary effort is normal.     Breath sounds: Normal breath sounds.  Abdominal:     General: Abdomen is flat.     Palpations: Abdomen is soft.     Tenderness: There is no abdominal tenderness.  Musculoskeletal:        General: No deformity.  Skin:    General: Skin is warm.     Findings: No rash.  Neurological:     General: No focal deficit present.     Mental Status: He is alert.     Comments: Cranial nerves II through XII intact. Intact sensation to light touch in all 4 extremities. 5/5 strength in all 4 extremities. Intact finger-to-nose and heel-to-shin of all 4 extremities. No visual field cuts. No neglect noted. No aphasia noted.  Psychiatric:         Mood and Affect: Mood normal.     ED Results / Procedures / Treatments   Labs (all labs ordered are listed, but only abnormal results are displayed) Labs Reviewed - No data to display  EKG None  Radiology No results found.  Procedures Procedures    Medications Ordered in ED Medications  ketorolac (TORADOL) 15 MG/ML injection 15 mg (15 mg Intravenous Given 08/29/22 1230)  metoCLOPramide (REGLAN) injection 10 mg (10 mg Intravenous Given 08/29/22 1230)  sodium chloride 0.9 % bolus 1,000 mL (1,000 mLs Intravenous New Bag/Given 08/29/22 1231)  diphenhydrAMINE (BENADRYL) injection 25 mg (25 mg Intravenous Given 08/29/22 1230)    ED Course/ Medical Decision Making/ A&P                                 Medical Decision Making Risk Prescription drug management.   This patient presents to the ED for headache, this involves an extensive number of treatment options, and is a complaint that carries with a high risk of complications and morbidity.  The differential diagnosis includes cluster headache, migraine, sinusitis, tension headache, traumatic intracranial hemorrhage..  This is not an exhaustive list.  Problem list/ ED course/ Critical interventions/ Medical management: HPI: See above Vital signs within normal range and stable throughout visit. Laboratory/imaging studies significant for: See above. On physical examination, patient is afebrile and appears in no acute distress.  There was no focal neurological deficit on my examination.  Symptoms are most consistent with benign headache from either tension type headache vs migraine. No headache red flags. Neurologic exam without evidence of AMS, focal neurologic findings so doubt meningitis, encephalitis, stroke. No history of trauma so doubt ICH. Doubt carotid artery dissection given no focal neuro deficits, no neck trauma or recent neck strain. Patient with no signs of increased intracranial pressure or weight loss and history and  physical suggest more benign headache so less likely mass effect in brain from tumor or abscess or idiopathic intracranial hypertension. Pain was controlled with headache cocktail and patient discharged home with PCP and neurology follow up. I have reviewed the patient home medicines and have made adjustments as needed.  Cardiac monitoring/EKG: The patient was maintained on a cardiac monitor.  I personally reviewed and interpreted the cardiac monitor which showed an underlying rhythm of: sinus rhythm.  Additional history obtained: External records from outside source obtained and reviewed including: Chart review including previous notes, labs, imaging.  Consultations obtained:  Disposition Continued outpatient therapy. Follow-up with PCP and neurology recommended for reevaluation of symptoms. Treatment plan discussed with patient.  Pt  acknowledged understanding was agreeable to the plan. Worrisome signs and symptoms were discussed with patient, and patient acknowledged understanding to return to the ED if they noticed these signs and symptoms. Patient was stable upon discharge.   This chart was dictated using voice recognition software.  Despite best efforts to proofread,  errors can occur which can change the documentation meaning.          Final Clinical Impression(s) / ED Diagnoses Final diagnoses:  Nonintractable headache, unspecified chronicity pattern, unspecified headache type    Rx / DC Orders ED Discharge Orders     None         Jeanelle Malling, [place] 08/29/22 1422    Laurence Spates, MD 08/29/22 [phone]

## 2022-08-29 NOTE — ED Triage Notes (Signed)
Patient presents to ER for migraine. Patient has no history of migraines. Pt states taking OTC medications with no relief.

## 2022-10-08 ENCOUNTER — Encounter (HOSPITAL_COMMUNITY): Payer: Self-pay

## 2022-10-08 ENCOUNTER — Emergency Department (HOSPITAL_COMMUNITY)
Admission: EM | Admit: 2022-10-08 | Discharge: 2022-10-09 | Disposition: A | Discharge status: Home / Self Care | Payer: BC Managed Care – PPO | Attending: Emergency Medicine | Admitting: Emergency Medicine

## 2022-10-08 DIAGNOSIS — R197 Diarrhea, unspecified: Secondary | ICD-10-CM | POA: Insufficient documentation

## 2022-10-08 DIAGNOSIS — R112 Nausea with vomiting, unspecified: Secondary | ICD-10-CM | POA: Insufficient documentation

## 2022-10-08 DIAGNOSIS — J45909 Unspecified asthma, uncomplicated: Secondary | ICD-10-CM | POA: Insufficient documentation

## 2022-10-08 DIAGNOSIS — E876 Hypokalemia: Secondary | ICD-10-CM | POA: Diagnosis not present

## 2022-10-08 LAB — CBC
HCT: 46.8 % (ref 39.0–52.0)
Hemoglobin: 15.1 g/dL (ref 13.0–17.0)
MCH: 29.5 pg (ref 26.0–34.0)
MCHC: 32.3 g/dL (ref 30.0–36.0)
MCV: 91.4 fL (ref 80.0–100.0)
Platelets: 312 10*3/uL (ref 150–400)
RBC: 5.12 MIL/uL (ref 4.22–5.81)
RDW: 12.4 % (ref 11.5–15.5)
WBC: 10.3 10*3/uL (ref 4.0–10.5)
nRBC: 0 % (ref 0.0–0.2)

## 2022-10-08 LAB — URINALYSIS, ROUTINE W REFLEX MICROSCOPIC
Bilirubin Urine: NEGATIVE
Glucose, UA: NEGATIVE mg/dL
Hgb urine dipstick: NEGATIVE
Ketones, ur: 5 mg/dL — AB
Leukocytes,Ua: NEGATIVE
Nitrite: NEGATIVE
Protein, ur: NEGATIVE mg/dL
Specific Gravity, Urine: 1.032 — ABNORMAL HIGH (ref 1.005–1.030)
pH: 5 (ref 5.0–8.0)

## 2022-10-08 LAB — COMPREHENSIVE METABOLIC PANEL
ALT: 22 U/L (ref 0–44)
AST: 20 U/L (ref 15–41)
Albumin: 4.3 g/dL (ref 3.5–5.0)
Alkaline Phosphatase: 63 U/L (ref 38–126)
Anion gap: 15 (ref 5–15)
BUN: 14 mg/dL (ref 6–20)
CO2: 20 mmol/L — ABNORMAL LOW (ref 22–32)
Calcium: 9.5 mg/dL (ref 8.9–10.3)
Chloride: 102 mmol/L (ref 98–111)
Creatinine, Ser: 0.75 mg/dL (ref 0.61–1.24)
GFR, Estimated: >60 mL/min (ref >60)
Glucose, Bld: 115 mg/dL — ABNORMAL HIGH (ref 70–99)
Potassium: 3.1 mmol/L — ABNORMAL LOW (ref 3.5–5.1)
Sodium: 137 mmol/L (ref 135–145)
Total Bilirubin: 0.6 mg/dL (ref 0.3–1.2)
Total Protein: 7.8 g/dL (ref 6.5–8.1)

## 2022-10-08 LAB — LIPASE, BLOOD: Lipase: 23 U/L (ref 11–51)

## 2022-10-08 MED ORDER — ONDANSETRON 8 MG PO TBDP
8.0000 mg | ORAL_TABLET | Freq: Once | ORAL | Status: AC
  Administered 2022-10-08: 8 mg via ORAL
  Filled 2022-10-08: qty 1

## 2022-10-08 MED ORDER — ONDANSETRON HCL 4 MG PO TABS: 4.0000 mg | ORAL_TABLET | Freq: Three times a day (TID) | ORAL | 0 refills | Status: DC | PRN

## 2022-10-08 NOTE — Discharge Instructions (Addendum)
Your evaluated today for nausea and vomiting.  Your symptoms are consistent with a viral illness.  This will likely resolve on its own.  Please take the prescribed medication as directed for nausea.  If you develop any life-threatening symptoms please return to the emergency department.

## 2022-10-08 NOTE — ED Triage Notes (Signed)
Pt arrived POV for emesis that started this am, pt denies abd pain, reports two episodes of diarrhea earlier today. NAD noted, VSS, A&O x4.

## 2022-10-08 NOTE — ED Provider Notes (Signed)
[place] EMERGENCY DEPARTMENT AT [facility] Provider Note   CSN: [id number] Arrival date & time: 10/08/22  1905     History {Add pertinent medical, surgical, social history, OB history to HPI:1} Chief Complaint  Patient presents with   Emesis    Glenn Davis is a 32 y.o. male.  Patient presents to the emergency room complaining of nausea, vomiting, and diarrhea which began this morning.  He states he has had 4-5 episodes of emesis throughout the day.  He denies any blood in his emesis.  Patient states that his child was sick with something similar a few days ago.  He is denying chest pain, abdominal pain, shortness of breath, fever.  Past medical history significant for asthma   Emesis      Home Medications Prior to Admission medications   Medication Sig Start Date End Date Taking? Authorizing Provider  ondansetron (ZOFRAN) 4 MG tablet Take 1 tablet (4 mg total) by mouth every 8 (eight) hours as needed for nausea or vomiting. 10/08/22  Yes Darrick Grinder, PA-C  acetaminophen (TYLENOL) 500 MG tablet Take 500 mg by mouth daily as needed for mild pain or moderate pain (migraine).     [provider]  albuterol (PROVENTIL HFA;VENTOLIN HFA) 108 (90 Base) MCG/ACT inhaler Inhale 1-2 puffs into the lungs every 6 (six) hours as needed for wheezing or shortness of breath. 04/20/17   Hedges, Tinnie Gens, PA-C  benzonatate (TESSALON) 100 MG capsule Take 1 capsule (100 mg total) by mouth every 8 (eight) hours. Patient not taking: Reported on 10/25/2017 10/06/17   Dietrich Pates, PA-C  fluticasone (FLONASE) 50 MCG/ACT nasal spray Place 1 spray into both nostrils daily. Patient not taking: Reported on 10/25/2017 10/06/17   Dietrich Pates, PA-C  HYDROcodone-acetaminophen (NORCO/VICODIN) 5-325 MG tablet Take 1-2 tablets by mouth every 6 (six) hours as needed. 10/25/17   Maxwell Caul, PA-C  ibuprofen (ADVIL,MOTRIN) 200 MG tablet Take 200 mg by mouth daily as needed (migraine).     [provider]  lidocaine (XYLOCAINE) 2 % solution Use as directed 15 mLs in the mouth or throat as needed for mouth pain. Patient not taking: Reported on 10/25/2017 10/06/17   Dietrich Pates, PA-C  methocarbamol (ROBAXIN) 500 MG tablet Take 1 tablet (500 mg total) by mouth every 8 (eight) hours as needed for muscle spasms. 08/11/22   Rancour, Jeannett Senior, MD  naproxen (NAPROSYN) 500 MG tablet Take 1 tablet (500 mg total) by mouth 2 (two) times daily as needed. 08/11/22   Rancour, Jeannett Senior, MD  tiZANidine (ZANAFLEX) 4 MG tablet Take 1 tablet (4 mg total) by mouth every 6 (six) hours as needed for muscle spasms. 07/16/22   Achille Rich, PA-C      Allergies    Patient has no known allergies.    Review of Systems   Review of Systems  Gastrointestinal:  Positive for vomiting.    Physical Exam Updated Vital Signs BP (!) 137/92   Pulse 87   Temp 98.3 F (36.8 C)   Resp 16   Ht 5\' 5"  (1.651 m)   Wt 86.2 kg   SpO2 97%   BMI 31.62 kg/m  Physical Exam Vitals and nursing note reviewed.  Constitutional:      General: He is not in acute distress.    Appearance: He is well-developed.  HENT:     Head: Normocephalic and atraumatic.  Eyes:     Conjunctiva/sclera: Conjunctivae normal.  Cardiovascular:     Rate and Rhythm: Normal  rate and regular rhythm.     Heart sounds: No murmur heard. Pulmonary:     Effort: Pulmonary effort is normal. No respiratory distress.     Breath sounds: Normal breath sounds.  Abdominal:     Palpations: Abdomen is soft.     Tenderness: There is no abdominal tenderness.  Musculoskeletal:        General: No swelling.     Cervical back: Neck supple.  Skin:    General: Skin is warm and dry.     Capillary Refill: Capillary refill takes less than 2 seconds.  Neurological:     Mental Status: He is alert.  Psychiatric:        Mood and Affect: Mood normal.     ED Results / Procedures / Treatments   Labs (all labs ordered are listed, but only abnormal  results are displayed) Labs Reviewed  COMPREHENSIVE METABOLIC PANEL - Abnormal; Notable for the following components:      Result Value   Potassium 3.1 (*)    CO2 20 (*)    Glucose, Bld 115 (*)    All other components within normal limits  URINALYSIS, ROUTINE W REFLEX MICROSCOPIC - Abnormal; Notable for the following components:   Specific Gravity, Urine 1.032 (*)    Ketones, ur 5 (*)    All other components within normal limits  LIPASE, BLOOD  CBC    EKG None  Radiology No results found.  Procedures Procedures  {Document cardiac monitor, telemetry assessment procedure when appropriate:1}  Medications Ordered in ED Medications  ondansetron (ZOFRAN-ODT) disintegrating tablet 8 mg (8 mg Oral Given 10/08/22 2336)    ED Course/ Medical Decision Making/ A&P   {   Click here for ABCD2, HEART and other calculatorsREFRESH Note before signing :1}                              Medical Decision Making Amount and/or Complexity of Data Reviewed Labs: ordered.  Risk Prescription drug management.   This patient presents to the ED for concern of nausea vomiting and diarrhea, this involves an extensive number of treatment options, and is a complaint that carries with it a high risk of complications and morbidity.  The differential diagnosis includes gastroenteritis, appendicitis, cholecystitis, others   Co morbidities that complicate the patient evaluation  Asthma   Lab Tests:  I Ordered, and personally interpreted labs.  The pertinent results include: Potassium 3.1, CMP, CBC, lipase, UA grossly unremarkable otherwise   Imaging Studies ordered:  Patient with no abdominal tenderness on exam.  No indication at this time for abdominal imaging.  No signs of acute/surgical abdomen   Problem List / ED Course / Critical interventions / Medication management   I ordered medication including Zofran for nausea Reevaluation of the patient after these medicines showed that the  patient improved I have reviewed the patients home medicines and have made adjustments as needed   Social Determinants of Health:  Patient has no PCP   Test / Admission - Considered:  Patient's lab work is reassuring.  He has no abdominal tenderness to suggest a surgical abdomen at this time.  Symptoms are most consistent with gastroenteritis, especially considering his child with similar symptoms over the past few days.  Patient was able to drink fluids without difficulty.  Plan to discharge home at this time with a prescription for Zofran.  No indication at this time for admission.  Discharge home with return  precautions.   {Document critical care time when appropriate:1} {Document review of labs and clinical decision tools ie heart score, Chads2Vasc2 etc:1}  {Document your independent review of radiology images, and any outside records:1} {Document your discussion with family members, caretakers, and with consultants:1} {Document social determinants of health affecting pt's care:1} {Document your decision making why or why not admission, treatments were needed:1} Final Clinical Impression(s) / ED Diagnoses Final diagnoses:  Nausea vomiting and diarrhea    Rx / DC Orders ED Discharge Orders          Ordered    ondansetron (ZOFRAN) 4 MG tablet  Every 8 hours PRN        10/08/22 2333

## 2022-10-31 IMAGING — CT CT RENAL STONE PROTOCOL
2 of 4 series · 17 of 46 positions shown, 19 images · non-contrast
Comparison: 10/25/2017

CLINICAL DATA: Left flank pain x2 days



[Series 2: axial st · axial · 0.88mm/px · z∈[-519,-124]mm · 14 of 91 slices shown, 16 images]
[im 6/91  soft-tissue]
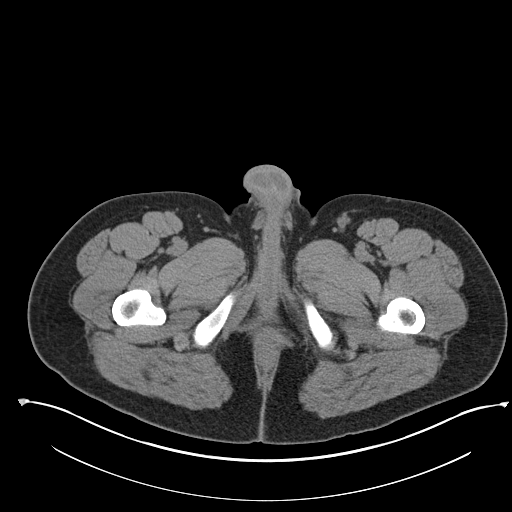
[im 6/91  bone]
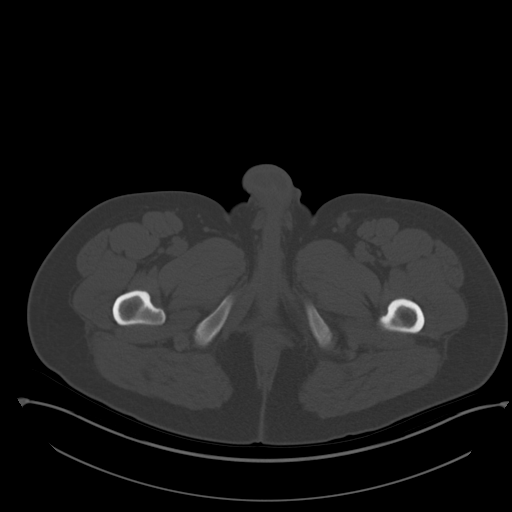
[im 11/91  soft-tissue]
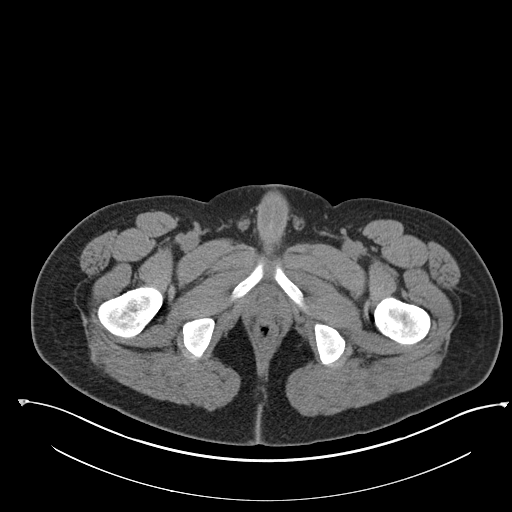
[im 16/91  soft-tissue]
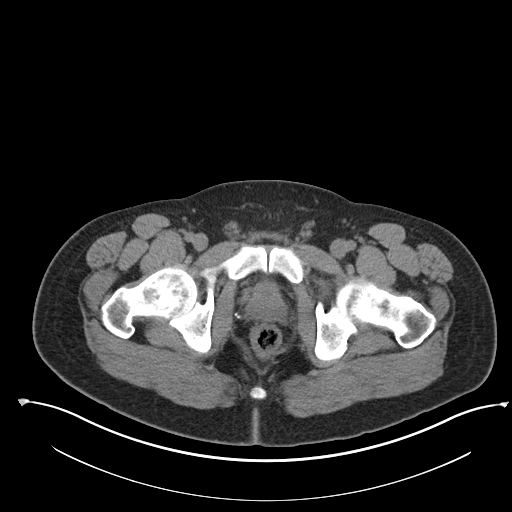
[im 27/91  soft-tissue]
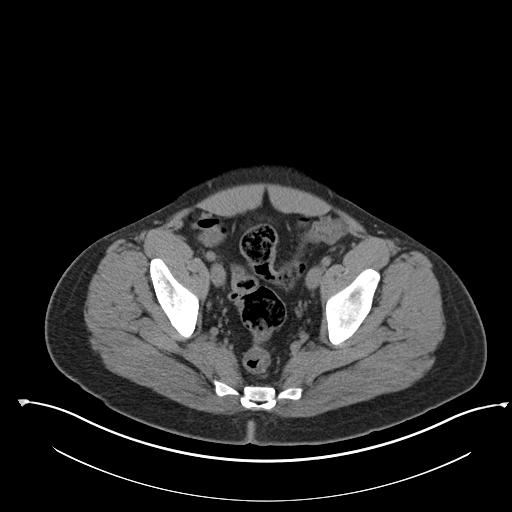
[im 32/91  soft-tissue]
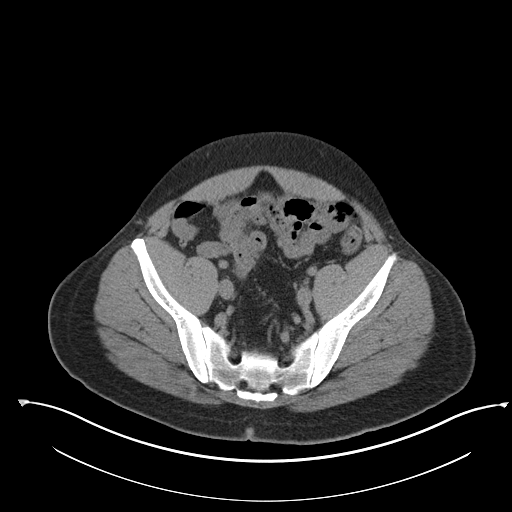
[im 38/91  soft-tissue]
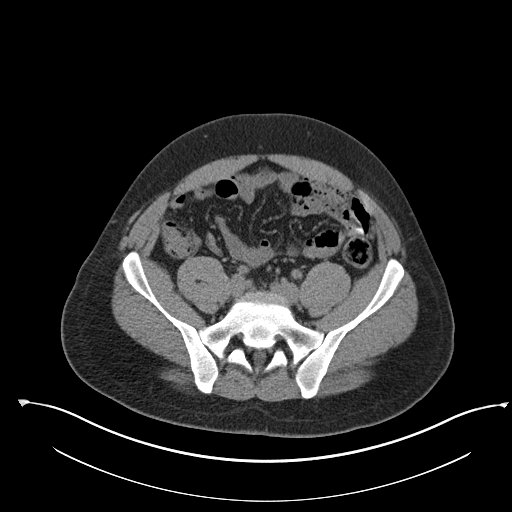
[im 43/91  soft-tissue]
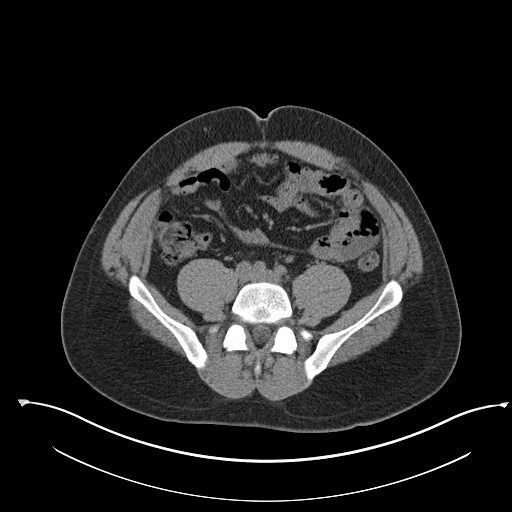
[im 48/91  soft-tissue]
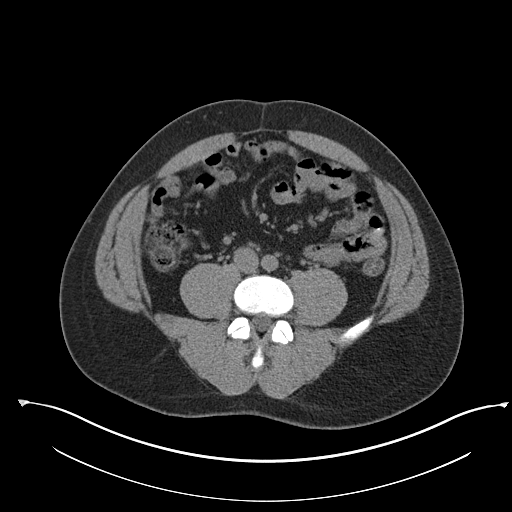
[im 53/91  soft-tissue]
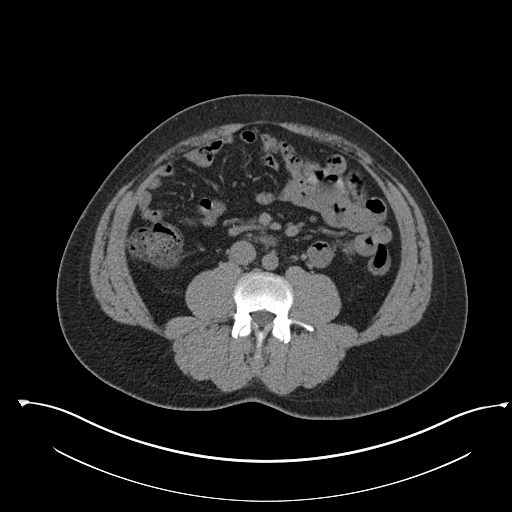
[im 53/91  bone]
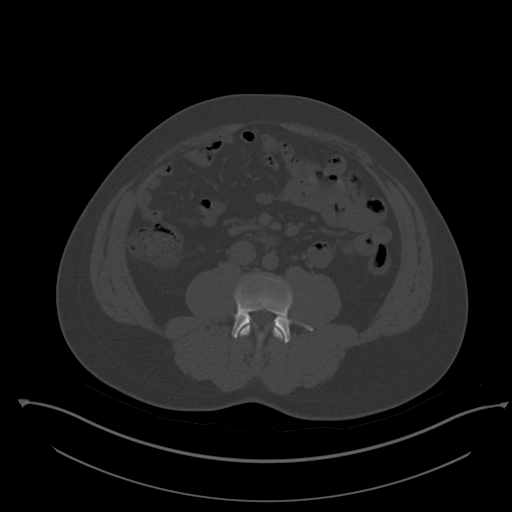
[im 59/91  soft-tissue]
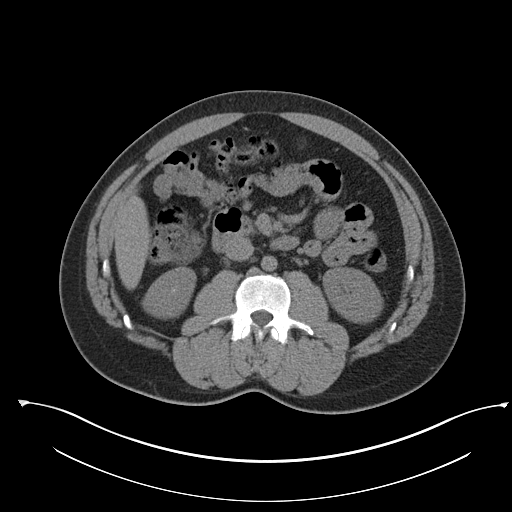
[im 69/91  soft-tissue]
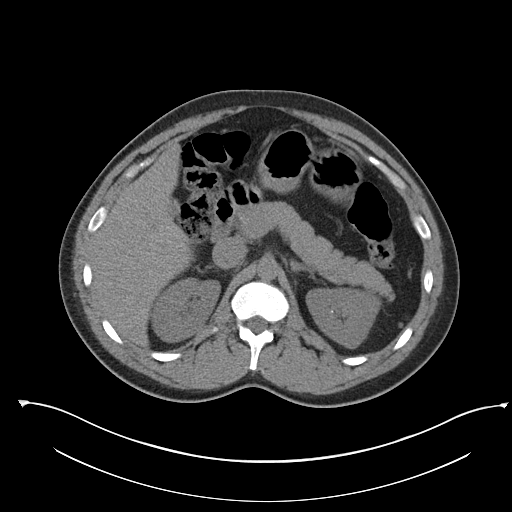
[im 75/91  soft-tissue]
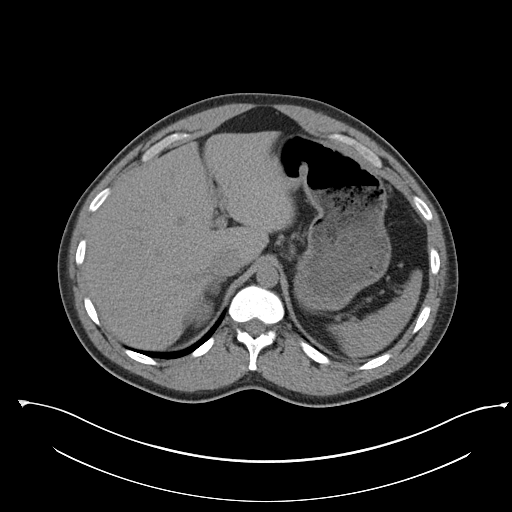
[im 80/91  soft-tissue]
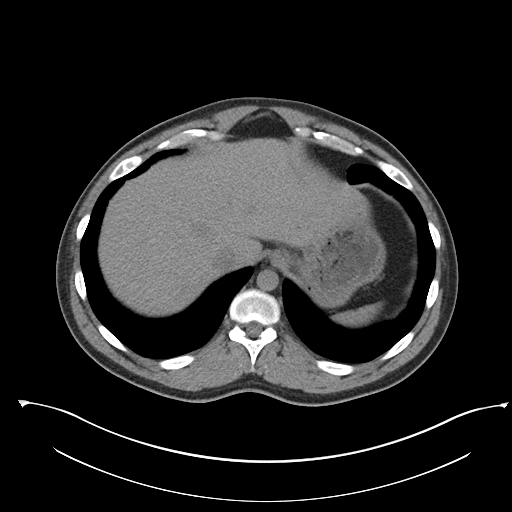
[im 85/91  soft-tissue]
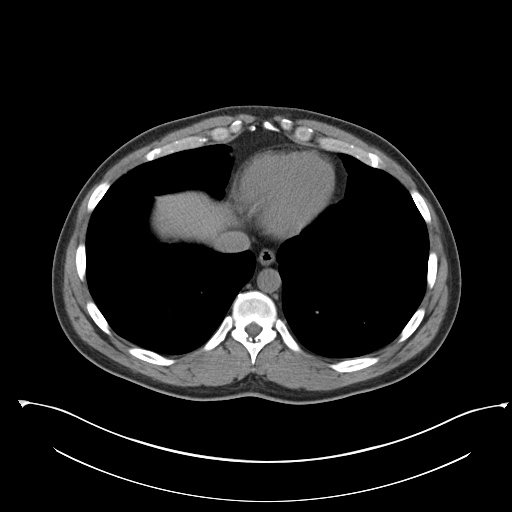

[Series 5: coronal · coronal · 0.82mm/px · 3 of 157 slices shown]
[im 53/157  soft-tissue]
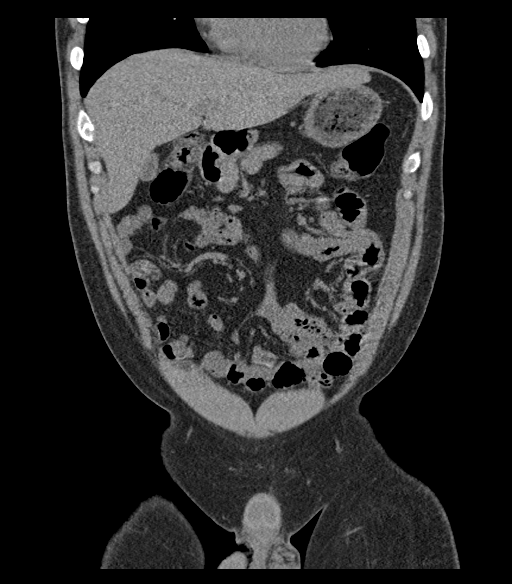
[im 70/157  soft-tissue]
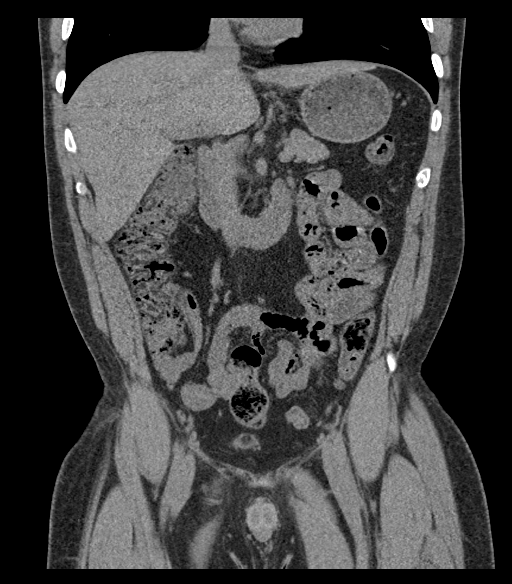
[im 87/157  soft-tissue]
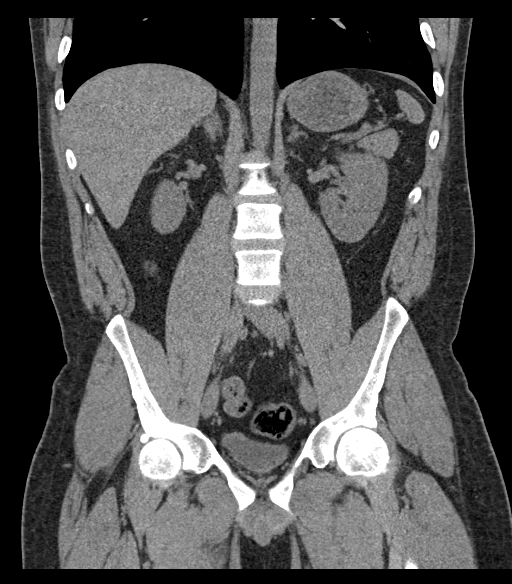

[17 of 46 positions shown; findings below may reference images not displayed]

FINDINGS: Lower chest: Lung bases are clear.

Hepatobiliary: Unenhanced liver is unremarkable.

Gallbladder is decompressed. No intrahepatic or extrahepatic ductal
dilatation.

Pancreas: Within normal limits.

Spleen: Within normal limits.

Adrenals/Urinary Tract: Adrenal glands are within normal limits.

Kidneys are notable for a punctate nonobstructing right lower pole
renal calculus (series 2/image 32). No hydronephrosis.

Bladder is underdistended but unremarkable.

Stomach/Bowel: Stomach is within normal limits.

No evidence of bowel obstruction.

Normal appendix (series 2/image 61).

Vascular/Lymphatic: No evidence of abdominal aortic aneurysm.

No suspicious abdominopelvic lymphadenopathy.

Reproductive: Prostate is unremarkable.

Other: No abdominopelvic ascites.

Musculoskeletal: Visualized osseous structures are within normal
limits.
IMPRESSION: Punctate nonobstructing right lower pole renal calculus. No
hydronephrosis.

No CT findings to account for the patient's left flank pain.

## 2022-11-18 ENCOUNTER — Other Ambulatory Visit: Payer: Self-pay

## 2022-11-18 ENCOUNTER — Emergency Department (HOSPITAL_COMMUNITY)
Admission: EM | Admit: 2022-11-18 | Discharge: 2022-11-18 | Disposition: A | Discharge status: Home / Self Care | Payer: BC Managed Care – PPO | Attending: Emergency Medicine | Admitting: Emergency Medicine

## 2022-11-18 DIAGNOSIS — R112 Nausea with vomiting, unspecified: Secondary | ICD-10-CM | POA: Diagnosis present

## 2022-11-18 DIAGNOSIS — K529 Noninfective gastroenteritis and colitis, unspecified: Secondary | ICD-10-CM | POA: Insufficient documentation

## 2022-11-18 LAB — CBC
HCT: 48.5 % (ref 39.0–52.0)
Hemoglobin: 15.7 g/dL (ref 13.0–17.0)
MCH: 29.1 pg (ref 26.0–34.0)
MCHC: 32.4 g/dL (ref 30.0–36.0)
MCV: 90 fL (ref 80.0–100.0)
Platelets: 380 10*3/uL (ref 150–400)
RBC: 5.39 MIL/uL (ref 4.22–5.81)
RDW: 12.4 % (ref 11.5–15.5)
WBC: 8.4 10*3/uL (ref 4.0–10.5)
nRBC: 0 % (ref 0.0–0.2)

## 2022-11-18 LAB — COMPREHENSIVE METABOLIC PANEL
ALT: 25 U/L (ref 0–44)
AST: 22 U/L (ref 15–41)
Albumin: 4.5 g/dL (ref 3.5–5.0)
Alkaline Phosphatase: 61 U/L (ref 38–126)
Anion gap: 11 (ref 5–15)
BUN: 10 mg/dL (ref 6–20)
CO2: 23 mmol/L (ref 22–32)
Calcium: 9.4 mg/dL (ref 8.9–10.3)
Chloride: 103 mmol/L (ref 98–111)
Creatinine, Ser: 0.71 mg/dL (ref 0.61–1.24)
GFR, Estimated: >60 mL/min (ref >60)
Glucose, Bld: 118 mg/dL — ABNORMAL HIGH (ref 70–99)
Potassium: 3.5 mmol/L (ref 3.5–5.1)
Sodium: 137 mmol/L (ref 135–145)
Total Bilirubin: 1.1 mg/dL (ref <1.2)
Total Protein: 8 g/dL (ref 6.5–8.1)

## 2022-11-18 LAB — URINALYSIS, ROUTINE W REFLEX MICROSCOPIC
Bilirubin Urine: NEGATIVE
Glucose, UA: NEGATIVE mg/dL
Hgb urine dipstick: NEGATIVE
Ketones, ur: NEGATIVE mg/dL
Leukocytes,Ua: NEGATIVE
Nitrite: NEGATIVE
Protein, ur: NEGATIVE mg/dL
Specific Gravity, Urine: 1.021 (ref 1.005–1.030)
pH: 6 (ref 5.0–8.0)

## 2022-11-18 LAB — LIPASE, BLOOD: Lipase: 21 U/L (ref 11–51)

## 2022-11-18 MED ORDER — ONDANSETRON HCL 4 MG PO TABS: 4.0000 mg | ORAL_TABLET | Freq: Four times a day (QID) | ORAL | 0 refills | Status: AC

## 2022-11-18 MED ORDER — LOPERAMIDE HCL 2 MG PO CAPS: 2.0000 mg | ORAL_CAPSULE | Freq: Four times a day (QID) | ORAL | 0 refills | Status: AC | PRN

## 2022-11-18 NOTE — ED Triage Notes (Signed)
Pt arrived via POV. C/o N/V/D since last night. No known sick contacts.  AOx4

## 2022-11-18 NOTE — Discharge Instructions (Addendum)
Today you were seen for nausea, vomiting and diarrhea.  Please pick up your medications and take as prescribed.  Thank you for letting us treat you today. After performing a physical exam and reviewing your labs, I feel you are safe to go home. Please follow up with your PCP in the next several days and provide them with your records from this visit. Return to the Emergency Room if pain becomes severe or symptoms worsen.

## 2022-11-18 NOTE — ED Provider Notes (Signed)
[place] EMERGENCY DEPARTMENT AT [facility] Provider Note   CSN: [id number] Arrival date & time: 11/18/22  1251     History  Chief Complaint  Patient presents with   Nausea   Emesis   Diarrhea    Glenn Davis is a 32 y.o. male with no significant past medical history presents today for nausea, vomiting, diarrhea since last night.  Patient denies any abdominal pain.  Patient endorses vomiting x 1 with nausea and multiple episodes of loose watery stools.  Patient denies mucus, blood, abnormal color or smell.  Denies chest pain, fever, respiratory symptoms, urinary symptoms, flank headache, or shortness of breath.  Patient does state he ate [employer] prior to symptoms starting.  Emesis Associated symptoms: diarrhea   Diarrhea Associated symptoms: vomiting        Home Medications Prior to Admission medications   Medication Sig Start Date End Date Taking? Authorizing Provider  loperamide (IMODIUM) 2 MG capsule Take 1 capsule (2 mg total) by mouth 4 (four) times daily as needed for diarrhea or loose stools. 11/18/22  Yes Dolphus Jenny, PA-C  ondansetron (ZOFRAN) 4 MG tablet Take 1 tablet (4 mg total) by mouth every 6 (six) hours. 11/18/22  Yes Dolphus Jenny, PA-C  acetaminophen (TYLENOL) 500 MG tablet Take 500 mg by mouth daily as needed for mild pain or moderate pain (migraine).     [provider]  albuterol (PROVENTIL HFA;VENTOLIN HFA) 108 (90 Base) MCG/ACT inhaler Inhale 1-2 puffs into the lungs every 6 (six) hours as needed for wheezing or shortness of breath. 04/20/17   Hedges, Tinnie Gens, PA-C  benzonatate (TESSALON) 100 MG capsule Take 1 capsule (100 mg total) by mouth every 8 (eight) hours. Patient not taking: Reported on 10/25/2017 10/06/17   Dietrich Pates, PA-C  fluticasone (FLONASE) 50 MCG/ACT nasal spray Place 1 spray into both nostrils daily. Patient not taking: Reported on 10/25/2017 10/06/17   Dietrich Pates, PA-C  HYDROcodone-acetaminophen (NORCO/VICODIN)  5-325 MG tablet Take 1-2 tablets by mouth every 6 (six) hours as needed. 10/25/17   Maxwell Caul, PA-C  ibuprofen (ADVIL,MOTRIN) 200 MG tablet Take 200 mg by mouth daily as needed (migraine).    [provider]  lidocaine (XYLOCAINE) 2 % solution Use as directed 15 mLs in the mouth or throat as needed for mouth pain. Patient not taking: Reported on 10/25/2017 10/06/17   Dietrich Pates, PA-C  methocarbamol (ROBAXIN) 500 MG tablet Take 1 tablet (500 mg total) by mouth every 8 (eight) hours as needed for muscle spasms. 08/11/22   Rancour, Jeannett Senior, MD  naproxen (NAPROSYN) 500 MG tablet Take 1 tablet (500 mg total) by mouth 2 (two) times daily as needed. 08/11/22   Rancour, Jeannett Senior, MD  tiZANidine (ZANAFLEX) 4 MG tablet Take 1 tablet (4 mg total) by mouth every 6 (six) hours as needed for muscle spasms. 07/16/22   Achille Rich, PA-C      Allergies    Patient has no known allergies.    Review of Systems   Review of Systems  Gastrointestinal:  Positive for diarrhea, nausea and vomiting.    Physical Exam Updated Vital Signs BP (!) 151/99 (BP Location: Right Arm)   Pulse 90   Temp 99.4 F (37.4 C) (Oral)   Resp 16   Ht 5\' 5"  (1.651 m)   Wt 79.4 kg   SpO2 97%   BMI 29.12 kg/m  Physical Exam Vitals and nursing note reviewed.  Constitutional:      General: He  is not in acute distress.    Appearance: He is well-developed.  HENT:     Head: Normocephalic and atraumatic.     Right Ear: External ear normal.     Left Ear: External ear normal.     Nose: Nose normal.  Eyes:     Conjunctiva/sclera: Conjunctivae normal.  Cardiovascular:     Rate and Rhythm: Normal rate and regular rhythm.     Heart sounds: No murmur heard. Pulmonary:     Effort: Pulmonary effort is normal. No respiratory distress.     Breath sounds: Normal breath sounds.  Abdominal:     General: Bowel sounds are normal.     Palpations: Abdomen is soft.     Tenderness: There is no abdominal tenderness. There is no  guarding.  Musculoskeletal:        General: No swelling.     Cervical back: Neck supple.  Skin:    General: Skin is warm and dry.     Capillary Refill: Capillary refill takes less than 2 seconds.  Neurological:     General: No focal deficit present.     Mental Status: He is alert.  Psychiatric:        Mood and Affect: Mood normal.     ED Results / Procedures / Treatments   Labs (all labs ordered are listed, but only abnormal results are displayed) Labs Reviewed  COMPREHENSIVE METABOLIC PANEL - Abnormal; Notable for the following components:      Result Value   Glucose, Bld 118 (*)    All other components within normal limits  LIPASE, BLOOD  CBC  URINALYSIS, ROUTINE W REFLEX MICROSCOPIC    EKG None  Radiology No results found.  Procedures Procedures    Medications Ordered in ED Medications - No data to display  ED Course/ Medical Decision Making/ A&P                                 Medical Decision Making Amount and/or Complexity of Data Reviewed Labs: ordered.   This patient presents to the ED with chief complaint(s) of nausea, vomiting, diarrhea with pertinent past medical history of none which further complicates the presenting complaint. The complaint involves an extensive differential diagnosis and also carries with it a high risk of complications and morbidity.    The differential diagnosis includes gastroenteritis, appendicitis, sepsis  ED Course and Reassessment:   Independent labs interpretation:  The following labs were independently interpreted:  CBC: No notable findings CMP: No notable findings UA: No notable findings  Consultation: - Consulted or discussed management/test interpretation w/ external professional: None  Consideration for admission or further workup: Patient considered for further workup or admission however patient's vital signs have remained stable while in the ER patient's physical exam and labs have all been reassuring.   Patient feels stable for discharge and can follow-up with PCP if symptoms persist        Final Clinical Impression(s) / ED Diagnoses Final diagnoses:  Gastroenteritis    Rx / DC Orders ED Discharge Orders          Ordered    loperamide (IMODIUM) 2 MG capsule  4 times daily PRN        11/18/22 1452    ondansetron (ZOFRAN) 4 MG tablet  Every 6 hours        11/18/22 1452  Dolphus Jenny, PA-C 11/18/22 1453    Glynn Octave, MD 11/18/22 [phone]

## 2022-11-28 ENCOUNTER — Emergency Department (HOSPITAL_COMMUNITY)
Admission: EM | Admit: 2022-11-28 | Discharge: 2022-11-28 | Disposition: A | Discharge status: Home / Self Care | Payer: BC Managed Care – PPO | Attending: Emergency Medicine | Admitting: Emergency Medicine

## 2022-11-28 ENCOUNTER — Other Ambulatory Visit: Payer: Self-pay

## 2022-11-28 ENCOUNTER — Encounter (HOSPITAL_COMMUNITY): Payer: Self-pay | Admitting: Emergency Medicine

## 2022-11-28 DIAGNOSIS — M545 Low back pain, unspecified: Secondary | ICD-10-CM | POA: Diagnosis present

## 2022-11-28 NOTE — Discharge Instructions (Signed)
Your symptoms are likely due to a muscle strain.  You can take Tylenol, Motrin for pain.  You should avoid heavy lifting for the next few days.  If you develop worsening pain, weakness or numbness you should return to the ED.

## 2022-11-28 NOTE — ED Provider Notes (Signed)
[place] EMERGENCY DEPARTMENT AT [facility] Provider Note   CSN: [id number] Arrival date & time: 11/28/22  1554     History  Chief Complaint  Patient presents with   Back Pain    Glenn Davis is a 32 y.o. male.   Back Pain Healthy 32 year old presenting for right lower back pain.  Patient works at the [employer].  Yesterday after bending over he developed pain to the right paraspinal lumbar spine.  No midline pain.  No radicular pain.  No weakness or numbness or saddle anesthesia.  No difficulty going the bathroom.  Is worse when he bends down, but at rest he does not have any pain.  He is concerned for muscle strain.  No pain in his neck or upper back.  No abdominal pain.  No urinary symptoms.     Home Medications Prior to Admission medications   Medication Sig Start Date End Date Taking? Authorizing Provider  acetaminophen (TYLENOL) 500 MG tablet Take 500 mg by mouth daily as needed for mild pain or moderate pain (migraine).     [provider]  albuterol (PROVENTIL HFA;VENTOLIN HFA) 108 (90 Base) MCG/ACT inhaler Inhale 1-2 puffs into the lungs every 6 (six) hours as needed for wheezing or shortness of breath. 04/20/17   Hedges, Tinnie Gens, PA-C  benzonatate (TESSALON) 100 MG capsule Take 1 capsule (100 mg total) by mouth every 8 (eight) hours. Patient not taking: Reported on 10/25/2017 10/06/17   Dietrich Pates, PA-C  fluticasone (FLONASE) 50 MCG/ACT nasal spray Place 1 spray into both nostrils daily. Patient not taking: Reported on 10/25/2017 10/06/17   Dietrich Pates, PA-C  HYDROcodone-acetaminophen (NORCO/VICODIN) 5-325 MG tablet Take 1-2 tablets by mouth every 6 (six) hours as needed. 10/25/17   Maxwell Caul, PA-C  ibuprofen (ADVIL,MOTRIN) 200 MG tablet Take 200 mg by mouth daily as needed (migraine).    [provider]  lidocaine (XYLOCAINE) 2 % solution Use as directed 15 mLs in the mouth or throat as needed for mouth pain. Patient not  taking: Reported on 10/25/2017 10/06/17   Dietrich Pates, PA-C  loperamide (IMODIUM) 2 MG capsule Take 1 capsule (2 mg total) by mouth 4 (four) times daily as needed for diarrhea or loose stools. 11/18/22   Dolphus Jenny, PA-C  methocarbamol (ROBAXIN) 500 MG tablet Take 1 tablet (500 mg total) by mouth every 8 (eight) hours as needed for muscle spasms. 08/11/22   Rancour, Jeannett Senior, MD  naproxen (NAPROSYN) 500 MG tablet Take 1 tablet (500 mg total) by mouth 2 (two) times daily as needed. 08/11/22   Rancour, Jeannett Senior, MD  ondansetron (ZOFRAN) 4 MG tablet Take 1 tablet (4 mg total) by mouth every 6 (six) hours. 11/18/22   Dolphus Jenny, PA-C  tiZANidine (ZANAFLEX) 4 MG tablet Take 1 tablet (4 mg total) by mouth every 6 (six) hours as needed for muscle spasms. 07/16/22   Achille Rich, PA-C      Allergies    Patient has no known allergies.    Review of Systems   Review of Systems  Musculoskeletal:  Positive for back pain.  Review of systems completed and notable as per HPI.  ROS otherwise negative.   Physical Exam Updated Vital Signs BP 135/88   Pulse 93   Temp 98.5 F (36.9 C) (Oral)   Resp 16   Ht 5\' 5"  (1.651 m)   Wt 79.4 kg   SpO2 99%   BMI 29.12 kg/m  Physical Exam Vitals and nursing note  reviewed.  Constitutional:      General: He is not in acute distress.    Appearance: He is well-developed.  HENT:     Head: Normocephalic and atraumatic.     Mouth/Throat:     Mouth: Mucous membranes are moist.     Pharynx: Oropharynx is clear.  Eyes:     Extraocular Movements: Extraocular movements intact.     Conjunctiva/sclera: Conjunctivae normal.     Pupils: Pupils are equal, round, and reactive to light.  Cardiovascular:     Rate and Rhythm: Normal rate and regular rhythm.     Heart sounds: No murmur heard. Pulmonary:     Effort: Pulmonary effort is normal. No respiratory distress.     Breath sounds: Normal breath sounds.  Abdominal:     Palpations: Abdomen is soft.     Tenderness:  There is no abdominal tenderness. There is no guarding or rebound.  Musculoskeletal:        General: No swelling.     Cervical back: Neck supple.     Comments: Mild tenderness to right paraspinal muscles.  No midline spinal tenderness.  Negative straight leg raise bilaterally.  Normal strength and sensation and distal pulses are intact.  Ambulates without difficulty.  No rash or skin changes.  Skin:    General: Skin is warm and dry.     Capillary Refill: Capillary refill takes less than 2 seconds.  Neurological:     Mental Status: He is alert.  Psychiatric:        Mood and Affect: Mood normal.     ED Results / Procedures / Treatments   Labs (all labs ordered are listed, but only abnormal results are displayed) Labs Reviewed - No data to display  EKG None  Radiology No results found.  Procedures Procedures    Medications Ordered in ED Medications - No data to display  ED Course/ Medical Decision Making/ A&P                                 Medical Decision Making  Medical Decision Making:   Glenn Davis is a 32 y.o. male who presented to the ED today with right paraspinal lumbar pain.  Vital signs reviewed.  On exam he has reproducible of his pain with palpation.  Suspect muscular strain.  He has no trauma, suspicion for fracture.  No radicular symptoms or signs of cauda equina syndrome or spinal cord compression.  I do not think needs imaging given only mild pain and reassuring exam.  Will plan to treat with Tylenol, Motrin and recommend he avoid heavy lifting for the next few days and follow-up with his PCP.  Return precautions given.  Patient is comfortable this plan.  Discharged.  Reviewed and confirmed nursing documentation for past medical history, family history, social history.   Patient's presentation is most consistent with acute complicated illness / injury requiring diagnostic workup.           Final Clinical Impression(s) / ED Diagnoses Final diagnoses:   Acute right-sided low back pain without sciatica    Rx / DC Orders ED Discharge Orders     None         Laurence Spates, MD 11/28/22 1944

## 2022-11-28 NOTE — ED Triage Notes (Signed)
Patient arrives ambulatory by POV c/o lower back pain when bending down and lifting. States he lifts a lot at work.
